# Patient Record
Sex: Male | Born: 1940
Health system: Southern US, Community
[De-identification: ages and names within clinical notes are randomized; demographics above are authoritative.]

## PROBLEM LIST (undated history)

## (undated) ENCOUNTER — Emergency Department (HOSPITAL_COMMUNITY): Discharge status: Absent without leave | Payer: Medicare HMO

## (undated) DIAGNOSIS — K635 Polyp of colon: Secondary | ICD-10-CM

## (undated) DIAGNOSIS — C61 Malignant neoplasm of prostate: Secondary | ICD-10-CM

## (undated) DIAGNOSIS — F329 Major depressive disorder, single episode, unspecified: Secondary | ICD-10-CM

## (undated) DIAGNOSIS — D72820 Lymphocytosis (symptomatic): Secondary | ICD-10-CM

## (undated) DIAGNOSIS — Z8669 Personal history of other diseases of the nervous system and sense organs: Secondary | ICD-10-CM

## (undated) DIAGNOSIS — F32A Depression, unspecified: Secondary | ICD-10-CM

## (undated) DIAGNOSIS — E785 Hyperlipidemia, unspecified: Secondary | ICD-10-CM

## (undated) DIAGNOSIS — I10 Essential (primary) hypertension: Secondary | ICD-10-CM

## (undated) DIAGNOSIS — Z87442 Personal history of urinary calculi: Secondary | ICD-10-CM

## (undated) DIAGNOSIS — G244 Idiopathic orofacial dystonia: Secondary | ICD-10-CM

## (undated) HISTORY — DX: Malignant neoplasm of prostate: C61

## (undated) HISTORY — DX: Polyp of colon: K63.5

## (undated) HISTORY — DX: Hyperlipidemia, unspecified: E78.5

## (undated) HISTORY — PX: PROSTATE BIOPSY: SHX241

## (undated) HISTORY — DX: Essential (primary) hypertension: I10

## (undated) HISTORY — DX: Depression, unspecified: F32.A

## (undated) HISTORY — DX: Idiopathic orofacial dystonia: G24.4

## (undated) HISTORY — DX: Lymphocytosis (symptomatic): D72.820

## (undated) HISTORY — DX: Major depressive disorder, single episode, unspecified: F32.9

---

## 1978-12-26 HISTORY — PX: URETEROLITHOTOMY: SHX71

## 2001-12-26 HISTORY — PX: REFRACTIVE SURGERY: SHX103

## 2002-03-29 ENCOUNTER — Encounter: Admission: RE | Admit: 2002-03-29 | Discharge: 2002-04-28 | Payer: Self-pay | Admitting: Pulmonary Disease

## 2005-01-28 ENCOUNTER — Ambulatory Visit: Payer: Self-pay | Admitting: Internal Medicine

## 2005-12-26 HISTORY — PX: COLONOSCOPY: SHX174

## 2006-03-09 ENCOUNTER — Ambulatory Visit: Payer: Self-pay | Admitting: Internal Medicine

## 2006-03-29 ENCOUNTER — Ambulatory Visit: Payer: Self-pay | Admitting: Internal Medicine

## 2008-01-18 ENCOUNTER — Ambulatory Visit: Payer: Self-pay | Admitting: Internal Medicine

## 2008-01-18 ENCOUNTER — Encounter: Payer: Self-pay | Admitting: Internal Medicine

## 2008-01-18 DIAGNOSIS — Z87442 Personal history of urinary calculi: Secondary | ICD-10-CM | POA: Insufficient documentation

## 2008-01-18 DIAGNOSIS — F329 Major depressive disorder, single episode, unspecified: Secondary | ICD-10-CM | POA: Insufficient documentation

## 2008-01-18 DIAGNOSIS — E785 Hyperlipidemia, unspecified: Secondary | ICD-10-CM | POA: Insufficient documentation

## 2008-01-18 DIAGNOSIS — I10 Essential (primary) hypertension: Secondary | ICD-10-CM | POA: Insufficient documentation

## 2008-01-18 DIAGNOSIS — R972 Elevated prostate specific antigen [PSA]: Secondary | ICD-10-CM | POA: Insufficient documentation

## 2008-01-18 DIAGNOSIS — N400 Enlarged prostate without lower urinary tract symptoms: Secondary | ICD-10-CM | POA: Insufficient documentation

## 2008-01-18 DIAGNOSIS — R21 Rash and other nonspecific skin eruption: Secondary | ICD-10-CM | POA: Insufficient documentation

## 2008-01-18 DIAGNOSIS — R1319 Other dysphagia: Secondary | ICD-10-CM | POA: Insufficient documentation

## 2008-01-30 ENCOUNTER — Encounter: Payer: Self-pay | Admitting: Internal Medicine

## 2008-02-25 ENCOUNTER — Ambulatory Visit: Payer: Self-pay | Admitting: Gastroenterology

## 2008-03-10 ENCOUNTER — Encounter: Payer: Self-pay | Admitting: Internal Medicine

## 2008-03-10 ENCOUNTER — Encounter: Payer: Self-pay | Admitting: Gastroenterology

## 2008-03-10 ENCOUNTER — Ambulatory Visit: Payer: Self-pay | Admitting: Gastroenterology

## 2008-03-14 ENCOUNTER — Encounter: Payer: Self-pay | Admitting: Internal Medicine

## 2008-03-24 ENCOUNTER — Ambulatory Visit: Admission: RE | Admit: 2008-03-24 | Discharge: 2008-06-22 | Payer: Self-pay | Admitting: Radiation Oncology

## 2008-03-25 ENCOUNTER — Encounter: Payer: Self-pay | Admitting: Internal Medicine

## 2008-04-15 ENCOUNTER — Encounter: Payer: Self-pay | Admitting: Internal Medicine

## 2009-01-28 ENCOUNTER — Encounter: Payer: Self-pay | Admitting: Internal Medicine

## 2009-05-01 ENCOUNTER — Encounter: Payer: Self-pay | Admitting: Internal Medicine

## 2010-02-18 ENCOUNTER — Encounter: Payer: Self-pay | Admitting: Internal Medicine

## 2010-06-18 ENCOUNTER — Encounter: Payer: Self-pay | Admitting: Internal Medicine

## 2011-01-23 LAB — CONVERTED CEMR LAB
ALT: 19 units/L (ref 0–53)
Bilirubin, Direct: 0.2 mg/dL (ref 0.0–0.3)
CO2: 30 meq/L (ref 19–32)
Calcium: 9.8 mg/dL (ref 8.4–10.5)
Cholesterol: 183 mg/dL (ref 0–200)
GFR calc Af Amer: 125 mL/min
Glucose, Bld: 119 mg/dL — ABNORMAL HIGH (ref 70–99)
HDL: 56.6 mg/dL (ref >39.0)
Ketones, ur: NEGATIVE mg/dL
LDL Cholesterol: 116 mg/dL — ABNORMAL HIGH (ref 0–99)
Potassium: 3.8 meq/L (ref 3.5–5.1)
Total CHOL/HDL Ratio: 3.2
Total Protein: 7.5 g/dL (ref 6.0–8.3)
Urine Glucose: NEGATIVE mg/dL

## 2011-01-25 NOTE — Letter (Signed)
[facility]   Imported By: Sherian Rein 07/01/2010 12:39:29  _____________________________________________________________________  External Attachment:    Type:   Image     Comment:   External Document

## 2011-01-28 NOTE — Letter (Signed)
Summary: [facility]   Imported By: Lester Providence 02/25/2010 08:44:09  _____________________________________________________________________  External Attachment:    Type:   Image     Comment:   External Document

## 2014-03-28 ENCOUNTER — Telehealth: Payer: Self-pay | Admitting: *Deleted

## 2014-03-28 NOTE — Telephone Encounter (Signed)
Called patient to introduce myself as Prostate Oncology Navigator and coordinator of the Prostate [place], to confirm his referral for the clinic on 04/04/14, location of [place], arrival time of 8:15 AM, registration procedure, and format of clinic.  He verbalized understanding.  I provided my phone number and encouraged him to call me if he has any questions after receiving the Information Packet or prior to my call the day before clinic.  He verbalized understanding and expressed appreciation for my call.  Gayleen Orem, RN, BSN, [facility] Prostate Oncology Navigator [phone]

## 2014-04-01 ENCOUNTER — Telehealth: Payer: Self-pay | Admitting: Oncology

## 2014-04-01 NOTE — Telephone Encounter (Signed)
C/D 04/01/14 for appt. 04/04/14 °

## 2014-04-02 ENCOUNTER — Encounter: Payer: Self-pay | Admitting: Radiation Oncology

## 2014-04-02 DIAGNOSIS — C61 Malignant neoplasm of prostate: Secondary | ICD-10-CM | POA: Insufficient documentation

## 2014-04-02 NOTE — Progress Notes (Signed)
GU Location of Tumor / Histology: prostatic adenocarcinoma  If Prostate Cancer, Gleason Score is (3 + 4) and PSA is (1.74)  Under active surveillance until January 2013 when he underwent a biopsy for a rising PSA.  Biopsies of prostate (if applicable) revealed:    Past/Anticipated interventions by urology, if any: active surveillance and referral to Dr. Tammi Klippel  Past/Anticipated interventions by medical oncology, if any: None  Weight changes, if any: denies  Bowel/Bladder complaints, if any: IPSS 2, denies change in his voiding habits or hematuria, denies night sweat   Nausea/Vomiting, if any: none noted  Pain issues, if any:  None noted  SAFETY ISSUES:  Prior radiation? NO  Pacemaker/ICD? NO  Possible current pregnancy? NO  Is the patient on methotrexate? NO  Current Complaints / other details:  48.9 cc prostate. 73 year old male. Widowed. Retired.

## 2014-04-03 ENCOUNTER — Encounter: Payer: Self-pay | Admitting: Radiation Oncology

## 2014-04-03 ENCOUNTER — Telehealth: Payer: Self-pay | Admitting: *Deleted

## 2014-04-03 NOTE — Progress Notes (Signed)
Radiation Oncology         [phone] ________________________________  Multidisciplinary Prostate Cancer Clinic  Initial Radiation Oncology Consultation  Name: Patrick Dudley MRN: [id number]  Date: 04/04/2014  DOB: 1941/09/11  WC:HENID Patrick Reichmann, MD  Dutch Gray, MD   REFERRING PHYSICIAN: Dutch Gray, MD  DIAGNOSIS: 73 y.o. gentleman with stage T1c adenocarcinoma of the prostate with a Gleason's score of 3+4 and a PSA of 4.59  HISTORY OF PRESENT ILLNESS::Patrick Dudley is a 73 y.o. gentleman who was noted to have an elevated PSA by his primary care physician, Dr. Cathlean Dudley in 2009.  Specifically, on January 18, 2008, his PSA was 4.19.  This represented an increase over the previous 2 years and the patient was referred to Dr. Raynelle Dudley for further evaluation.  Dr. Alinda Dudley performed a digital rectal exam on January 30, 2008.  Rectal exam showed normal sphincter tone and the prostate was estimated at 40 grams and there was no nodularity appreciated.  The patient subsequently returned for transrectal ultrasound with prostate biopsies on March 4th.2009.  The prostate was measured at 58.95 cc's with no hypoechoic nodules.  Biopsies showed adenocarcinoma with a Gleason Score of 3+3, occupying less than 5% of the left apex specimens.  The remainder of the submitted tissue showed benign prostate.  Patrick Dudley has returned to discuss the pathology findings with Dr. Raynelle Dudley.  He was kindly referred to meet with me on 03/25/08 for consultation regarding radiation therapy.  He decided on active surveillance with ongoing PSA's and biopsies on 06/10/08, 10/02/09, 11/05/10, 01/03/12 showing no progression.   On 03/21/14, the patient proceeded to transrectal ultrasound with biopsies.  Out of 12 core biopsies, one was positive.  The maximum Gleason score was 3+4, and this was seen in the left lateral mid gland.  The patient reviewed the biopsy results with his urologist and he has kindly been referred today to the  multidisciplinary prostate cancer clinic for presentation of pathology and radiology studies in our conference for discussion of potential radiation treatment options and clinical evaluation.  PREVIOUS RADIATION THERAPY: No  PAST MEDICAL HISTORY:  has a past medical history of Prostate cancer; Depression; Dyslipidemia; Glaucoma; Hypertension; Lymphocytosis (symptomatic); Personal history of urinary calculi; and Orofacial dyskinesia.    PAST SURGICAL HISTORY: Past Surgical History  Procedure Laterality Date  . Lithotripsy    . Prostate biopsy      FAMILY HISTORY: family history includes Cancer in his brother and father; Nephrolithiasis in his brother.  SOCIAL HISTORY:  reports that he quit smoking about 26 years ago. His smoking use included Cigarettes. He smoked 0.00 packs per day. He has never used smokeless tobacco. He reports that he does not drink alcohol or use illicit drugs.  ALLERGIES: Review of patient's allergies indicates no known allergies.  MEDICATIONS:  Current Outpatient Prescriptions  Medication Sig Dispense Refill  . AMLODIPINE BESYLATE PO Take 10 mg by mouth.      Marland Kitchen aspirin 81 MG tablet Take 81 mg by mouth daily.      . clonazePAM (KLONOPIN) 0.5 MG tablet Take 0.5 mg by mouth 2 (two) times daily as needed for anxiety.      . mirtazapine (REMERON) 15 MG tablet Take 15 mg by mouth at bedtime.      . Misc Natural Products (PROSTATE SUPPORT PO) Take by mouth.      . Omega-3 Fatty Acids (FISH OIL) 1000 MG CAPS Take by mouth.       No current facility-administered medications  for this encounter.    REVIEW OF SYSTEMS:  A 15 point review of systems is documented in the electronic medical record. This was obtained by the nursing staff. However, I reviewed this with the patient to discuss relevant findings and make appropriate changes.  A comprehensive review of systems was negative..  The patient completed an IPSS and IIEF questionnaire.  His IPSS score was 2 indicating mild  urinary outflow obstructive symptoms.  He indicated that his erectile function is able to complete sexual activity 'always.'   PHYSICAL EXAM: This patient is in no acute distress.  He is alert and oriented.   He exhibits no respiratory distress or labored breathing.  He appears neurologically intact.  His mood is pleasant.  His affect is appropriate.  Please note the digital rectal exam findings described above.  KPS = 100  100 - Normal; no complaints; no evidence of disease. 90   - Able to carry on normal activity; minor signs or symptoms of disease. 80   - Normal activity with effort; some signs or symptoms of disease. 15   - Cares for self; unable to carry on normal activity or to do active work. 60   - Requires occasional assistance, but is able to care for most of his personal needs. 50   - Requires considerable assistance and frequent medical care. 25   - Disabled; requires special care and assistance. 51   - Severely disabled; hospital admission is indicated although death not imminent. 13   - Very sick; hospital admission necessary; active supportive treatment necessary. 10   - Moribund; fatal processes progressing rapidly. 0     - Dead  Karnofsky DA, Abelmann [place], Craver LS and Burchenal [facility] [phone]) The use of the nitrogen mustards in the palliative treatment of carcinoma: with particular reference to bronchogenic carcinoma Cancer 1 [phone]   LABORATORY DATA:  No results found for this basename: WBC,  HGB,  HCT,  MCV,  PLT   Lab Results  Component Value Date   NA 141 01/18/2008   K 3.8 01/18/2008   CL 104 01/18/2008   CO2 30 01/18/2008   Lab Results  Component Value Date   ALT 19 01/18/2008   AST 27 01/18/2008   ALKPHOS 69 01/18/2008   BILITOT 1.2 01/18/2008     RADIOGRAPHY: No results found.    IMPRESSION: This gentleman is a 73 y.o. gentleman with stage T1c adenocarcinoma of the prostate with a Gleason's score of 3+4 and a PSA of 4.59.  His T-Stage, Gleason's Score, and PSA  put him into the intermediate risk group.  He falls into a subset of intermediate risk patients with primary Gleason grade 3 and lower volume unilateral disease, who do very well with brachytherapy alone. Accordingly he is eligible for a variety of potential treatment options including prostatectomy, IMRT or seed implant.  PLAN:Today I reviewed the findings and workup thus far.  We discussed the natural history of prostate cancer.  We reviewed the the implications of T-stage, Gleason's Score, and PSA on decision-making and outcomes in prostate cancer.  We discussed radiation treatment in the management of prostate cancer with regard to the logistics and delivery of external beam radiation treatment as well as the logistics and delivery of prostate brachytherapy.  We compared and contrasted each of these approaches and also compared these against prostatectomy.  The patient expressed interest in prostate brachytherapy.  I filled out a patient counseling form for him with relevant treatment diagrams and we retained  a copy for our records.   The patient would like to proceed with prostate brachytherapy.  I will share my findings with Dr. Alinda Dudley and move forward with scheduling the procedure in the near future.     I enjoyed meeting with him today, and will look forward to participating in the care of this very nice gentleman.   I spent 40 minutes face to face with the patient and more than 50% of that time was spent in counseling and/or coordination of care.   ------------------------------------------------  Sheral Apley. Tammi Klippel, M.D.

## 2014-04-03 NOTE — Telephone Encounter (Signed)
Called patient to see if he had any questions prior to his attendance at tomorrow's Prostate [place].  He stated he did not.  He confirmed he would bring the completed health information forms he received in the mailing I sent, confirmed his understanding of a  8:15 arrival time, confirmed his understanding of the [facility] location.  Gayleen Orem, RN, BSN, [facility] Prostate Oncology Navigator [phone]

## 2014-04-04 ENCOUNTER — Encounter: Payer: Self-pay | Admitting: Specialist

## 2014-04-04 ENCOUNTER — Ambulatory Visit (HOSPITAL_BASED_OUTPATIENT_CLINIC_OR_DEPARTMENT_OTHER): Payer: Medicare Other | Admitting: Oncology

## 2014-04-04 ENCOUNTER — Encounter: Payer: Self-pay | Admitting: *Deleted

## 2014-04-04 ENCOUNTER — Encounter: Payer: Self-pay | Admitting: Oncology

## 2014-04-04 ENCOUNTER — Encounter: Payer: Self-pay | Admitting: Radiation Oncology

## 2014-04-04 ENCOUNTER — Telehealth: Payer: Self-pay | Admitting: *Deleted

## 2014-04-04 ENCOUNTER — Ambulatory Visit
Admission: RE | Admit: 2014-04-04 | Discharge: 2014-04-04 | Disposition: A | Discharge status: Home / Self Care | Payer: Medicare Other | Source: Ambulatory Visit | Attending: Radiation Oncology | Admitting: Radiation Oncology

## 2014-04-04 VITALS — BP 171/92 | HR 58 | Temp 98.7°F | Resp 16 | Ht 71.0 in | Wt 138.0 lb

## 2014-04-04 DIAGNOSIS — C61 Malignant neoplasm of prostate: Secondary | ICD-10-CM

## 2014-04-04 NOTE — Progress Notes (Signed)
Met with patient and friend in prostrate clinic. Patient is a warm and friendly man. Chaplain provided education about [employer] and services. Reviewed the Distress Screen with him and explored his emotional resources. He said he has minimal distress and rated himself as "0" on the Distress Scale. He has a strong [place] faith that he uses well for coping. Patient requested prayer; he prayed as well as the chaplain.   Epifania Gore, Bonney Roussel, [facility], PhD [phone]; [phone]

## 2014-04-04 NOTE — Telephone Encounter (Signed)
Called patient to inform of pre-seed appt. For 04-10-14 @ 9:00 am, lvm for a return call

## 2014-04-04 NOTE — Progress Notes (Signed)
Please see consult note.  

## 2014-04-04 NOTE — Consult Note (Signed)
Reason for Referral: Prostate cancer  HPI: This is a 73 year old gentleman who is a relatively healthy except for mild hyperlipidemia and depression. He does have a history of orofacial dyskinesia that has been reasonably manageable. His history of prostate cancer dates back to March of 2009 where he presented with a PSA of 4.19 and a biopsy showed the presence of low volume Gleason score 3+3 equals 6 prostate cancer. He had 1/12 cores involved and completely asymptomatic. He had a clinical stage TI C. He continued to be under the care of Dr. Alinda Money with active surveillance. He had repeat biopsies in June of 2009, October 2010, November 2011, January of 2013, and most recently in March of 2015.  His most recent PSA continue to be low at 1.74 that his repeat biopsy showed another core of Gleason score 3+4 equals 7 in the left lateral mid section. The majority of the other cores show high-grade PIN. Patient referred to the prostate cancer multidisciplinary clinic for a discussion. Clinically, he is asymptomatic. He did not report any urinary symptoms. He does not report any frequency urgency or hematuria. He does not have any lower urinary tract symptoms or erectile dysfunction symptoms.   Past Medical History  Diagnosis Date  . Prostate cancer   . Depression   . Dyslipidemia   . Glaucoma   . Hypertension   . Lymphocytosis (symptomatic)   . Personal history of urinary calculi   . Orofacial dyskinesia   :  Past Surgical History  Procedure Laterality Date  . Lithotripsy    . Prostate biopsy    :  Current Outpatient Prescriptions  Medication Sig Dispense Refill  . AMLODIPINE BESYLATE PO Take 10 mg by mouth.      Marland Kitchen aspirin 81 MG tablet Take 81 mg by mouth daily.      . clonazePAM (KLONOPIN) 0.5 MG tablet Take 0.5 mg by mouth 2 (two) times daily as needed for anxiety.      . mirtazapine (REMERON) 15 MG tablet Take 15 mg by mouth at bedtime.      . Misc Natural Products (PROSTATE SUPPORT PO)  Take by mouth.      . Omega-3 Fatty Acids (FISH OIL) 1000 MG CAPS Take by mouth.       No current facility-administered medications for this visit.      No Known Allergies:  Family History  Problem Relation Age of Onset  . Cancer Father     prostate  . Nephrolithiasis Brother   . Cancer Brother     prostate  :  History   Social History  . Marital Status: Widowed    Spouse Name: N/A    Number of Children: N/A  . Years of Education: N/A   Occupational History  . Not on file.   Social History Main Topics  . Smoking status: Former Smoker    Types: Cigarettes    Quit date: 12/27/1987  . Smokeless tobacco: Never Used  . Alcohol Use: No  . Drug Use: No  . Sexual Activity: No   Other Topics Concern  . Not on file   Social History Narrative  . No narrative on file  :  Constitutional: negative for anorexia, chills and fatigue Eyes: negative for icterus and irritation Ears, nose, mouth, throat, and face: negative for earaches, epistaxis and hearing loss Respiratory: negative for asthma and sputum Cardiovascular: negative for chest pain, exertional chest pressure/discomfort and irregular heart beat Gastrointestinal: negative for abdominal pain, diarrhea and nausea Genitourinary:negative for  dysuria, frequency and hematuria Integument/breast: negative for pruritus and rash Hematologic/lymphatic: negative for bleeding, easy bruising and lymphadenopathy Musculoskeletal:negative for arthralgias, back pain and bone pain Neurological: negative for coordination problems, dizziness and gait problems Behavioral/Psych: negative for anxiety, depression and fatigue Endocrine: negative for temperature intolerance Allergic/Immunologic: negative for anaphylaxis and urticaria    Exam:ECOG 0 There were no vitals taken for this visit. General appearance: alert, cooperative and appears stated age Throat: lips, mucosa, and tongue normal; teeth and gums normal Neck: no adenopathy,  no carotid bruit, no JVD, supple, symmetrical, trachea midline and thyroid not enlarged, symmetric, no tenderness/mass/nodules Back: symmetric, no curvature. ROM normal. No CVA tenderness. Resp: clear to auscultation bilaterally Chest wall: no tenderness Cardio: regular rate and rhythm, S1, S2 normal, no murmur, click, rub or gallop GI: soft, non-tender; bowel sounds normal; no masses,  no organomegaly Extremities: extremities normal, atraumatic, no cyanosis or edema Pulses: 2+ and symmetric Skin: Skin color, texture, turgor normal. No rashes or lesions Lymph nodes: Cervical, supraclavicular, and axillary nodes normal. Neurologic: Grossly normal    Assessment and Plan:   73 year old gentleman with prostate cancer dating back to 2009. He presented with a PSA of 4.19 and a Gleason score 3+3 equals 6 he had low-volume disease with 1/12 cores. He had been on active surveillance under the care of Dr. Alinda Money and a repeat biopsy in March of 2015 showed a higher Gleason score of 3+4 equals 7. His PSA is 1.74 he continues to have low volume disease and no lower urinary tract symptoms. The natural course of this disease was discussed today in detail with the patient and his wife. His pathology slides were discussed with the reviewing pathologists also his case was discussed extensively of a part of her multidisciplinary team. We feel that given the transition from a Gleason 6 to a Gleason 7 prostate cancer warrants a discussion regarding treatment at this time. Although active surveillance might continue be an option for him, he would be at increased risk of developing progressive disease and potentially lethal disease left untreated.  Options of treatments were discussed today quitting surgical resection versus radiation therapy in the form of external beam radiation or seed implants. He is interested in the seed implant form of radiation and seems to be leaning that way. Dr. Tammi Klippel from radiation oncology  to discuss that with him and probably will arrange for that in the near future.  I see no role for any systemic therapy at this time but I explained to him he develops advanced disease he will require systemic therapy possibly in the form of hormonal therapy and maybe systemic chemotherapy if he becomes castration resistant disease.

## 2014-04-04 NOTE — Progress Notes (Signed)
Denies hx of radiation therapy. Denies having a pacemaker. NKDA. Reports that he is retired. Widowed. Has one daughter, Llana Aliment, who is 80, works for [employer], and resides in [place]. Reports that he wears glasses.

## 2014-04-04 NOTE — Consult Note (Signed)
Chief Complaint  Prostate cancer   Reason For Visit  Reason for consult: To discuss treatment options for prostate cancer with the multidisciplinary prostate cancer team.   History of Present Illness  Patrick Dudley is a 73 year old gentleman with prostate cancer initially diagnosed in 2009. He has been counseled by myself and Dr. Tammi Klippel and elected to be managed with active surveillance. He had stable findings on surveillance biopsies until January 2013 when he underwent a biopsy for a rising PSA (4.59) which demonstrated only Gleason 3+3=6 adenocarcinoma with PIN-like ductal adenocarcinoma but with an area of atypical cribiform glands suspicious for cribiform adenocarcinoma.  We again discussed options and he elected to continue with surveillance. In March 2015, he underwent a surveillance biopsy with upgraded Gleason 3+4=7 adenocarcinoma noted.  Initial diagnosis: March 2009 TNM stage: cT1c Nx Mx Gleason score: 3+3=6 PSA: 4.19 Initial biopsy (02/27/08): 1/12 cores positive -- 1/2 cores from L apex (<5%), Volume 59cc Prostate Px Plus (from biopsy in June 2009): 73% risk of favorable pathology, 4% risk of disease progression  Surveillance biopsy (06/10/08): 2/24 cores positive -- L apex (5%) and L lateral apex (10%), Volume 48.9cc Surveillance biopsy (10/02/09): 12 core - atypical glands, HGPIN, no malignancy  Surveillance biopsy (11/05/10): 14 core - L lateral apex (5%,3+3=6), L apex (HGPIN), L lateral mid (10%, 3+3=6), L base (HGPIN) Surveillance biopsy (01/03/12): 12 cores (performed due to PSA rise to 4.59) - L base (< 5%, 3+3=6), R mid (15%, PIN-like ductal adenocarcinoma, 3+3=6), L mid (atypical cribiform glands suspicious for cribiform adenocarcinoma) Surveillance biopsy (03/11/14):PSA 2.81, 12 cores - L lateral mid (60%, 3+4=7), Multiple areas of atypical glands and HGPIN, Vol 48 cc   Family history: Both parents lived into their late 72s. His brother currently has metastatic prostate  cancer. Baseline SHIM: 24 Baseline IPSS: 2   Interval history:  He follows up today in the company of his girlfriend to discuss his recent biopsy results and options for management/treatment of his prostate cancer.  Unfortunately, he was found to have upgraded disease with Gleason 3+4 = 7 disease in 1 core in the left side of the prostate.  He otherwise remains asymptomatic.     Past Medical History  1. History of Dyslipidemia (272.4)  2. History of depression (V11.8)  3. History of glaucoma (V12.49)  4. History of hypertension (V12.59)  5. History of kidney stones (V13.01)  6. 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 Prostate cancer (185)  Surgical History  1. History of Lipectomy  2. History of Lithotripsy - Whole Body (Extracorporeal Shock Wave)  Current Meds  1. AmLODIPine Besylate 10 MG Oral Tablet;  Therapy: (Recorded:04Feb2009) to Recorded  2. Aspirin 81 MG Oral Tablet;  Therapy: (Recorded:11May2012) to Recorded  3. ClonazePAM 0.5 MG Oral Tablet;  Therapy: (Recorded:04Feb2009) to Recorded  4. Fish Oil CAPS;  Therapy: (Recorded:11May2012) to Recorded  5. Mirtazapine 15 MG Oral Tablet;  Therapy: (Recorded:04Feb2009) to Recorded  6. Prostate Support TABS;  Therapy: (Recorded:11May2012) to Recorded  Allergies  1. No Known Drug Allergies  Family History  1. Family history of Nephrolithiasis : Brother  2. Family history of Prostate Cancer (W09.81) : Father  3. Family history of Prostate Cancer (X91.47) : Brother  Social History   Denied: Alcohol Use   Marital History - Widowed   Occupation:   Tobacco Use (V15.82)  Physical Exam Constitutional: Well nourished and well developed . No acute distress.  ENT:. The ears and nose are normal in appearance.  Neck:  The appearance of the neck is normal and no neck mass is present.  Pulmonary: No respiratory distress, normal respiratory rhythm and effort and clear bilateral breath sounds.  Cardiovascular: Heart rate  and rhythm are normal . No peripheral edema.  Skin: Normal skin turgor, no visible rash and no visible skin lesions.  Neuro/Psych:. Mood and affect are appropriate.    Results/Data  I have reviewed his pathology slides today in conference as well as reviewed his medical records and recent PSA results and his prior biopsy results.     Assessment  1. Prostate cancer (185)  Discussion/Summary     1.  Prostate cancer: We have reviewed options for treatment/management considering his upgraded disease.  He understands a continued surveillance would come at an increased risk at this point.  We also discussed the fact that he has tremendous longevity in his family with his mother having lived to be over 49 and his father having died at age 37.  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  After our initial discussion, he agrees that he would like to proceed with treatment of curative intent at this point in time.  We therefore discussed options including both a primary surgical approach versus primary radiation therapy.   The patient was counseled about the natural history of prostate cancer and the standard treatment options that are available for prostate cancer. It was explained to him how his age and life expectancy, clinical stage, Gleason score, and PSA affect his prognosis, the decision to proceed with additional staging studies, as well as how that information influences recommended treatment strategies. We discussed the roles for active surveillance, radiation therapy, surgical therapy, androgen deprivation, as well as ablative therapy options for the treatment of prostate cancer as appropriate to his individual cancer situation. We discussed the risks and benefits of these options with regard to their impact on cancer control and also in terms of potential adverse events, complications, and impact on quiality of life  particularly related to urinary, bowel, and sexual function. The patient was encouraged to ask questions throughout the discussion today and all questions were answered to his stated satisfaction. In addition, the patient was provided with and/or directed to appropriate resources and literature for further education about prostate cancer and treatment options.   After reviewing his options in detail discussing the pros and cons of different approaches, he is confident that he would like to see with a radiation seed implantation and has discussed this in detail with Dr. Tammi Klippel today.  He will be scheduled for a CT arch study and we will tentatively plan on scheduling a date for his seed implant.  He also reminds me that he strongly wishes to come into the office the following day to have his catheter removed by our staff as he feels very uncomfortable removing the catheter himself.  Cc: Dr. Tyler Pita Dr. Zola Button Dr. Cathlean Cower  A total of 45 minutes were spent in the overall care of the patient today with 45 minutes in direct face to face consultation.    Signatures Electronically signed by : Patrick Dudley, M.D.; Apr 04 2014 11:06AM EST

## 2014-04-04 NOTE — Progress Notes (Signed)
Met with patient as part of Prostate MDC.  Reintroduced my role as his navigator and encouraged him to call as he proceeds with treatments and appointments at [facility].  Provided the accompanying Care Plan Summary at the close of clinic:                                        Care Plan Summary  Name:  Patrick Dudley DOB:  Mar 21, 1941  Your Medical Team:   Urologist -  Dr. Raynelle Bring, [facility]  Radiation Oncologist - Dr. Tyler Pita, [facility]   Medical Oncologist - Dr. Zola Button, [place]  Recommendation: 1) Radioactive Seed Implant * These recommendations are based on information available as of today's consult.      Recommendations may change depending on the results of further tests or exams.  Next Step: 1) Enid Derry will be calling you to schedule [phone]).  When appointments need to be scheduled, you will be contacted by [facility] and/or [facility].  Questions? Please do not hesitate to call Gayleen Orem, RN, BSN, [facility] at [phone] with any questions or concerns.  Liliane Channel is Counsellor and is available to assist you while you're receiving your medical care at [facility]. ______________________________________________________________________________________________________________________  I encouraged him to call me with any questions or concerns as his treatments progress.  He indicated understanding.  Gayleen Orem, RN, BSN, [facility] Prostate Oncology Navigator [phone]   .

## 2014-04-07 ENCOUNTER — Other Ambulatory Visit: Payer: Self-pay | Admitting: Urology

## 2014-04-07 ENCOUNTER — Telehealth: Payer: Self-pay | Admitting: *Deleted

## 2014-04-07 NOTE — Telephone Encounter (Signed)
Called patient to inform of implant date, no answer will call later. 

## 2014-04-09 ENCOUNTER — Encounter: Payer: Self-pay | Admitting: Radiation Oncology

## 2014-04-09 ENCOUNTER — Telehealth: Payer: Self-pay | Admitting: *Deleted

## 2014-04-09 NOTE — Progress Notes (Signed)
  Radiation Oncology         [phone] ________________________________  Name: TYDARIUS YAWN MRN: [id number]  Date: 04/10/2014  DOB: September 14, 1941  SIMULATION AND TREATMENT PLANNING NOTE PUBIC ARCH STUDY  VE:LFYBO Jenny Reichmann, MD  Dutch Gray, MD  DIAGNOSIS: 73 y.o. gentleman with stage T1c adenocarcinoma of the prostate with a Gleason's score of 3+4 and a PSA of 4.59  COMPLEX SIMULATION:  The patient presented today for evaluation for possible prostate seed implant. He was brought to the radiation planning suite and placed supine on the CT couch. A 3-dimensional image study set was obtained in upload to the planning computer. There, on each axial slice, I contoured the prostate gland. Then, using three-dimensional radiation planning tools I reconstructed the prostate in view of the structures from the transperineal needle pathway to assess for possible pubic arch interference. In doing so, I did not appreciate any pubic arch interference. Also, the patient's prostate volume was estimated based on the drawn structure. The volume was 48 cc.  Given the pubic arch appearance and prostate volume, patient remains a good candidate to proceed with prostate seed implant. Today, he freely provided informed written consent to proceed.    PLAN: The patient will undergo prostate seed implant.   ________________________________  Sheral Apley. Tammi Klippel, M.D.

## 2014-04-09 NOTE — Telephone Encounter (Signed)
CALLED PATIENT TO REMIND OF APPT. FOR 04-10-14 @ 9 AM, LVM FOR A RETURN CALL

## 2014-04-10 ENCOUNTER — Ambulatory Visit
Admission: RE | Admit: 2014-04-10 | Discharge: 2014-04-10 | Disposition: A | Discharge status: Home / Self Care | Payer: Medicare Other | Source: Ambulatory Visit | Attending: Radiation Oncology | Admitting: Radiation Oncology

## 2014-04-10 VITALS — Wt 126.3 lb

## 2014-04-10 DIAGNOSIS — C61 Malignant neoplasm of prostate: Secondary | ICD-10-CM

## 2014-04-10 NOTE — Addendum Note (Signed)
Encounter addended by: Heywood Footman, RN on: 04/10/2014 10:40 AM<BR>     Documentation filed: Vitals Section

## 2014-04-11 ENCOUNTER — Encounter (HOSPITAL_BASED_OUTPATIENT_CLINIC_OR_DEPARTMENT_OTHER)
Admission: RE | Admit: 2014-04-11 | Discharge: 2014-04-11 | Disposition: A | Discharge status: Home / Self Care | Payer: Medicare Other | Source: Ambulatory Visit | Attending: Urology | Admitting: Urology

## 2014-04-11 ENCOUNTER — Other Ambulatory Visit: Payer: Self-pay

## 2014-04-11 ENCOUNTER — Ambulatory Visit (HOSPITAL_BASED_OUTPATIENT_CLINIC_OR_DEPARTMENT_OTHER)
Admission: RE | Admit: 2014-04-11 | Discharge: 2014-04-11 | Disposition: A | Discharge status: Home / Self Care | Payer: Medicare Other | Source: Ambulatory Visit | Attending: Urology | Admitting: Urology

## 2014-04-11 DIAGNOSIS — Z01818 Encounter for other preprocedural examination: Secondary | ICD-10-CM | POA: Insufficient documentation

## 2014-04-11 DIAGNOSIS — C61 Malignant neoplasm of prostate: Secondary | ICD-10-CM | POA: Insufficient documentation

## 2014-04-11 DIAGNOSIS — I517 Cardiomegaly: Secondary | ICD-10-CM | POA: Insufficient documentation

## 2014-04-11 DIAGNOSIS — Z01810 Encounter for preprocedural cardiovascular examination: Secondary | ICD-10-CM | POA: Insufficient documentation

## 2014-05-28 ENCOUNTER — Telehealth: Payer: Self-pay | Admitting: *Deleted

## 2014-05-28 ENCOUNTER — Encounter (HOSPITAL_BASED_OUTPATIENT_CLINIC_OR_DEPARTMENT_OTHER): Payer: Self-pay | Admitting: *Deleted

## 2014-05-28 NOTE — Telephone Encounter (Signed)
Called patient to remind of appt., lvm for a return call

## 2014-05-29 LAB — COMPREHENSIVE METABOLIC PANEL
ALT: 11 U/L (ref 0–53)
AST: 21 U/L (ref 0–37)
Albumin: 4.5 g/dL (ref 3.5–5.2)
Alkaline Phosphatase: 63 U/L (ref 39–117)
BUN: 10 mg/dL (ref 6–23)
CO2: 32 meq/L (ref 19–32)
Calcium: 9.7 mg/dL (ref 8.4–10.5)
Chloride: 100 mEq/L (ref 96–112)
Creatinine, Ser: 0.71 mg/dL (ref 0.50–1.35)
GFR calc Af Amer: >90 mL/min (ref >90)
Glucose, Bld: 99 mg/dL (ref 70–99)
Potassium: 4.3 mEq/L (ref 3.7–5.3)
SODIUM: 141 meq/L (ref 137–147)
Total Bilirubin: 1.7 mg/dL — ABNORMAL HIGH (ref 0.3–1.2)
Total Protein: 7.6 g/dL (ref 6.0–8.3)

## 2014-05-29 LAB — CBC
HCT: 42.3 % (ref 39.0–52.0)
Hemoglobin: 14.3 g/dL (ref 13.0–17.0)
MCH: 31.4 pg (ref 26.0–34.0)
MCHC: 33.8 g/dL (ref 30.0–36.0)
MCV: 92.8 fL (ref 78.0–100.0)
Platelets: 246 10*3/uL (ref 150–400)
RBC: 4.56 MIL/uL (ref 4.22–5.81)
RDW: 12.5 % (ref 11.5–15.5)
WBC: 6.6 10*3/uL (ref 4.0–10.5)

## 2014-05-29 LAB — PROTIME-INR
INR: 0.98 (ref 0.00–1.49)
Prothrombin Time: 12.8 seconds (ref 11.6–15.2)

## 2014-05-29 LAB — APTT: APTT: 28 s (ref 24–37)

## 2014-05-30 ENCOUNTER — Encounter (HOSPITAL_BASED_OUTPATIENT_CLINIC_OR_DEPARTMENT_OTHER): Payer: Self-pay | Admitting: *Deleted

## 2014-05-30 NOTE — Progress Notes (Signed)
NPO AFTER MN WITH EXCEPTION CLEAR LIQUIDS UNTIL 0830 (NO CREAM/ MILK PRODUCTS).  ARRIVE AT 1300.  CURRENT LAB RESULTS , CXR, AND EKG IN CHART AND EPIC. WILL TAKE NORVASC AND KLONOPIN AM DOS W/ SIPS OF WATER AND DO FLEET ENEMA.

## 2014-06-04 ENCOUNTER — Telehealth: Payer: Self-pay | Admitting: *Deleted

## 2014-06-04 NOTE — Telephone Encounter (Signed)
CALLED PATIENT TO REMIND OF PROCEDURE FOR 06-05-14, LVM FOR A RETURN CALL

## 2014-06-04 NOTE — H&P (Signed)
Chief Complaint  Prostate cancer     History of Present Illness  Patrick Dudley is a 73 year old gentleman with prostate cancer initially diagnosed in 2009. He has been counseled by myself and Dr. Tammi Klippel and elected to be managed with active surveillance. He had stable findings on surveillance biopsies until January 2013 when he underwent a biopsy for a rising PSA (4.59) which demonstrated only Gleason 3+3=6 adenocarcinoma with PIN-like ductal adenocarcinoma but with an area of atypical cribiform glands suspicious for cribiform adenocarcinoma.  We again discussed options and he elected to continue with surveillance. In March 2015, he underwent a surveillance biopsy with upgraded Gleason 3+4=7 adenocarcinoma noted.  Initial diagnosis: March 2009 TNM stage: cT1c Nx Mx Gleason score: 3+3=6 PSA: 4.19 Initial biopsy (02/27/08): 1/12 cores positive -- 1/2 cores from L apex (<5%), Volume 59cc Prostate Px Plus (from biopsy in June 2009): 73% risk of favorable pathology, 4% risk of disease progression  Surveillance biopsy (06/10/08): 2/24 cores positive -- L apex (5%) and L lateral apex (10%), Volume 48.9cc Surveillance biopsy (10/02/09): 12 core - atypical glands, HGPIN, no malignancy  Surveillance biopsy (11/05/10): 14 core - L lateral apex (5%,3+3=6), L apex (HGPIN), L lateral mid (10%, 3+3=6), L base (HGPIN) Surveillance biopsy (01/03/12): 12 cores (performed due to PSA rise to 4.59) - L base (< 5%, 3+3=6), R mid (15%, PIN-like ductal adenocarcinoma, 3+3=6), L mid (atypical cribiform glands suspicious for cribiform adenocarcinoma) Surveillance biopsy (03/11/14):PSA 2.81, 12 cores - L lateral mid (60%, 3+4=7), Multiple areas of atypical glands and HGPIN, Vol 48 cc   Family history: Both parents lived into their late 64s. His brother currently has metastatic prostate cancer. Baseline SHIM: 24 Baseline IPSS: 2       Past Medical History  1. History of Dyslipidemia (272.4)  2. History of depression  (V11.8)  3. History of glaucoma (V12.49)  4. History of hypertension (V12.59)  5. History of kidney stones (V13.01)  6. 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 Prostate cancer (185)  Surgical History  1. History of Lipectomy  2. History of Lithotripsy - Whole Body (Extracorporeal Shock Wave)  Current Meds  1. AmLODIPine Besylate 10 MG Oral Tablet;  Therapy: (Recorded:04Feb2009) to Recorded  2. Aspirin 81 MG Oral Tablet;  Therapy: (Recorded:11May2012) to Recorded  3. ClonazePAM 0.5 MG Oral Tablet;  Therapy: (Recorded:04Feb2009) to Recorded  4. Fish Oil CAPS;  Therapy: (Recorded:11May2012) to Recorded  5. Mirtazapine 15 MG Oral Tablet;  Therapy: (Recorded:04Feb2009) to Recorded  6. Prostate Support TABS;  Therapy: (Recorded:11May2012) to Recorded  Allergies  1. No Known Drug Allergies  Family History  1. Family history of Nephrolithiasis : Brother  2. Family history of Prostate Cancer (T73.22) : Father  3. Family history of Prostate Cancer (G25.42) : Brother  Social History   Denied: Alcohol Use   Marital History - Widowed   Occupation:   Tobacco Use (V15.82)  Physical Exam Constitutional: Well nourished and well developed . No acute distress.  ENT:. The ears and nose are normal in appearance.  Neck: The appearance of the neck is normal and no neck mass is present.  Pulmonary: No respiratory distress, normal respiratory rhythm and effort and clear bilateral breath sounds.  Cardiovascular: Heart rate and rhythm are normal . No peripheral edema.  Skin: Normal skin turgor, no visible rash and no visible skin lesions.  Neuro/Psych:. Mood and affect are appropriate.   Discussion/Summary     1.  Prostate cancer:  After reviewing his options in  detail discussing the pros and cons of different approaches, he is confident that he would like to proceed with a radiation seed implantation.

## 2014-06-05 ENCOUNTER — Encounter (HOSPITAL_BASED_OUTPATIENT_CLINIC_OR_DEPARTMENT_OTHER)
Admission: RE | Disposition: A | Discharge status: Home / Self Care | Payer: Self-pay | Source: Ambulatory Visit | Attending: Urology

## 2014-06-05 ENCOUNTER — Ambulatory Visit (HOSPITAL_BASED_OUTPATIENT_CLINIC_OR_DEPARTMENT_OTHER)
Admission: RE | Admit: 2014-06-05 | Discharge: 2014-06-05 | Disposition: A | Discharge status: Home / Self Care | Payer: Medicare Other | Source: Ambulatory Visit | Attending: Urology | Admitting: Urology

## 2014-06-05 ENCOUNTER — Ambulatory Visit (HOSPITAL_COMMUNITY): Payer: Medicare Other

## 2014-06-05 ENCOUNTER — Encounter (HOSPITAL_BASED_OUTPATIENT_CLINIC_OR_DEPARTMENT_OTHER): Payer: Medicare Other | Admitting: Anesthesiology

## 2014-06-05 ENCOUNTER — Ambulatory Visit (HOSPITAL_BASED_OUTPATIENT_CLINIC_OR_DEPARTMENT_OTHER): Payer: Medicare Other | Admitting: Anesthesiology

## 2014-06-05 ENCOUNTER — Encounter (HOSPITAL_BASED_OUTPATIENT_CLINIC_OR_DEPARTMENT_OTHER): Payer: Self-pay | Admitting: *Deleted

## 2014-06-05 DIAGNOSIS — Z79899 Other long term (current) drug therapy: Secondary | ICD-10-CM | POA: Insufficient documentation

## 2014-06-05 DIAGNOSIS — I10 Essential (primary) hypertension: Secondary | ICD-10-CM | POA: Insufficient documentation

## 2014-06-05 DIAGNOSIS — C61 Malignant neoplasm of prostate: Secondary | ICD-10-CM | POA: Insufficient documentation

## 2014-06-05 DIAGNOSIS — Z87891 Personal history of nicotine dependence: Secondary | ICD-10-CM | POA: Insufficient documentation

## 2014-06-05 DIAGNOSIS — F329 Major depressive disorder, single episode, unspecified: Secondary | ICD-10-CM | POA: Insufficient documentation

## 2014-06-05 DIAGNOSIS — Z7982 Long term (current) use of aspirin: Secondary | ICD-10-CM | POA: Insufficient documentation

## 2014-06-05 DIAGNOSIS — F3289 Other specified depressive episodes: Secondary | ICD-10-CM | POA: Insufficient documentation

## 2014-06-05 HISTORY — DX: Personal history of urinary calculi: Z87.442

## 2014-06-05 HISTORY — DX: Personal history of other diseases of the nervous system and sense organs: Z86.69

## 2014-06-05 HISTORY — PX: CYSTOSCOPY: SHX5120

## 2014-06-05 HISTORY — PX: RADIOACTIVE SEED IMPLANT: SHX5150

## 2014-06-05 SURGERY — INSERTION, RADIATION SOURCE, PROSTATE
Anesthesia: General | Site: Prostate

## 2014-06-05 MED ORDER — FENTANYL CITRATE 0.05 MG/ML IJ SOLN
INTRAMUSCULAR | Status: AC
  Filled 2014-06-05: qty 4

## 2014-06-05 MED ORDER — PROPOFOL 10 MG/ML IV BOLUS
INTRAVENOUS | Status: DC | PRN
  Administered 2014-06-05: 150 mg via INTRAVENOUS
  Administered 2014-06-05: 25 mg via INTRAVENOUS

## 2014-06-05 MED ORDER — LACTATED RINGERS IV SOLN
INTRAVENOUS | Status: DC
  Administered 2014-06-05 (×2): via INTRAVENOUS
  Filled 2014-06-05: qty 1000

## 2014-06-05 MED ORDER — TAMSULOSIN HCL 0.4 MG PO CAPS: 0.4000 mg | ORAL_CAPSULE | Freq: Every day | ORAL | Status: DC

## 2014-06-05 MED ORDER — STERILE WATER FOR IRRIGATION IR SOLN
Status: DC | PRN
  Administered 2014-06-05: 3000 mL

## 2014-06-05 MED ORDER — ACETAMINOPHEN 10 MG/ML IV SOLN
INTRAVENOUS | Status: DC | PRN
  Administered 2014-06-05: 1000 mg via INTRAVENOUS

## 2014-06-05 MED ORDER — IOHEXOL 350 MG/ML SOLN
INTRAVENOUS | Status: DC | PRN
  Administered 2014-06-05: 7 mL

## 2014-06-05 MED ORDER — CIPROFLOXACIN HCL 500 MG PO TABS: 500.0000 mg | ORAL_TABLET | Freq: Two times a day (BID) | ORAL | Status: DC

## 2014-06-05 MED ORDER — LIDOCAINE HCL (CARDIAC) 20 MG/ML IV SOLN
INTRAVENOUS | Status: DC | PRN
  Administered 2014-06-05: 50 mg via INTRAVENOUS

## 2014-06-05 MED ORDER — DEXAMETHASONE SODIUM PHOSPHATE 4 MG/ML IJ SOLN
INTRAMUSCULAR | Status: DC | PRN
  Administered 2014-06-05: 10 mg via INTRAVENOUS

## 2014-06-05 MED ORDER — FLEET ENEMA 7-19 GM/118ML RE ENEM
1.0000 | ENEMA | Freq: Once | RECTAL | Status: DC
  Filled 2014-06-05: qty 1

## 2014-06-05 MED ORDER — EPHEDRINE SULFATE 50 MG/ML IJ SOLN
INTRAMUSCULAR | Status: DC | PRN
  Administered 2014-06-05 (×2): 5 mg via INTRAVENOUS
  Administered 2014-06-05: 10 mg via INTRAVENOUS

## 2014-06-05 MED ORDER — HYDROCODONE-ACETAMINOPHEN 5-325 MG PO TABS: 1.0000 | ORAL_TABLET | Freq: Four times a day (QID) | ORAL | Status: DC | PRN

## 2014-06-05 MED ORDER — CIPROFLOXACIN IN D5W 400 MG/200ML IV SOLN
400.0000 mg | INTRAVENOUS | Status: AC
  Administered 2014-06-05: 400 mg via INTRAVENOUS
  Filled 2014-06-05: qty 200

## 2014-06-05 MED ORDER — FENTANYL CITRATE 0.05 MG/ML IJ SOLN
INTRAMUSCULAR | Status: DC | PRN
  Administered 2014-06-05: 50 ug via INTRAVENOUS
  Administered 2014-06-05 (×2): 25 ug via INTRAVENOUS

## 2014-06-05 MED ORDER — BELLADONNA ALKALOIDS-OPIUM 16.2-60 MG RE SUPP
RECTAL | Status: AC
  Filled 2014-06-05: qty 1

## 2014-06-05 MED ORDER — ONDANSETRON HCL 4 MG/2ML IJ SOLN
INTRAMUSCULAR | Status: DC | PRN
  Administered 2014-06-05: 4 mg via INTRAVENOUS

## 2014-06-05 SURGICAL SUPPLY — 30 items
BAG DRAIN URO-CYSTO SKYTR STRL (DRAIN) ×3 IMPLANT
BAG DRN UROCATH (DRAIN) ×2
BAG URINE DRAINAGE (UROLOGICAL SUPPLIES) ×3 IMPLANT
BLADE SURG ROTATE 9660 (MISCELLANEOUS) ×3 IMPLANT
CANISTER SUCT LVC 12 LTR MEDI- (MISCELLANEOUS) IMPLANT
CATH FOLEY 2WAY SLVR  5CC 16FR (CATHETERS) ×2
CATH FOLEY 2WAY SLVR 5CC 16FR (CATHETERS) ×4 IMPLANT
CATH ROBINSON RED A/P 20FR (CATHETERS) ×3 IMPLANT
CLOTH BEACON ORANGE TIMEOUT ST (SAFETY) ×3 IMPLANT
COVER MAYO STAND STRL (DRAPES) ×3 IMPLANT
COVER TABLE BACK 60X90 (DRAPES) ×3 IMPLANT
DRAPE CAMERA CLOSED 9X96 (DRAPES) ×3 IMPLANT
DRSG TEGADERM 4X4.75 (GAUZE/BANDAGES/DRESSINGS) ×3 IMPLANT
DRSG TEGADERM 8X12 (GAUZE/BANDAGES/DRESSINGS) ×3 IMPLANT
ELECT REM PT RETURN 9FT ADLT (ELECTROSURGICAL) ×3
ELECTRODE REM PT RTRN 9FT ADLT (ELECTROSURGICAL) ×2 IMPLANT
GLOVE BIO SURGEON STRL SZ7.5 (GLOVE) ×6 IMPLANT
GLOVE ECLIPSE 8.0 STRL XLNG CF (GLOVE) ×12 IMPLANT
GOWN PREVENTION PLUS LG XLONG (DISPOSABLE) ×3 IMPLANT
GOWN STRL REIN XL XLG (GOWN DISPOSABLE) ×3 IMPLANT
HOLDER FOLEY CATH W/STRAP (MISCELLANEOUS) ×3 IMPLANT
NEEDLE HYPO 22GX1.5 SAFETY (NEEDLE) IMPLANT
NS IRRIG 500ML POUR BTL (IV SOLUTION) IMPLANT
PACK CYSTOSCOPY (CUSTOM PROCEDURE TRAY) ×3 IMPLANT
SPONGE GAUZE 4X4 12PLY STER LF (GAUZE/BANDAGES/DRESSINGS) ×3 IMPLANT
SYRINGE 10CC LL (SYRINGE) ×3 IMPLANT
UNDERPAD 30X30 INCONTINENT (UNDERPADS AND DIAPERS) ×6 IMPLANT
WATER STERILE IRR 3000ML UROMA (IV SOLUTION) ×3 IMPLANT
WATER STERILE IRR 500ML POUR (IV SOLUTION) ×3 IMPLANT
radioactive seed ×261 IMPLANT

## 2014-06-05 NOTE — Anesthesia Preprocedure Evaluation (Signed)
Anesthesia Evaluation  Patient identified by MRN, date of birth, ID band Patient awake    Reviewed: Allergy & Precautions, H&P , NPO status , Patient's Chart, lab work & pertinent test results  Airway Mallampati: II TM Distance: >3 FB Neck ROM: Full    Dental  (+) Missing, Dental Advisory Given, Poor Dentition   Pulmonary neg pulmonary ROS, former smoker,  breath sounds clear to auscultation  Pulmonary exam normal       Cardiovascular hypertension, Pt. on medications negative cardio ROS  Rhythm:Regular Rate:Normal     Neuro/Psych Orofacial dyskinesia negative psych ROS   GI/Hepatic negative GI ROS, Neg liver ROS,   Endo/Other  negative endocrine ROS  Renal/GU negative Renal ROS   Prostate CA negative genitourinary   Musculoskeletal negative musculoskeletal ROS (+)   Abdominal   Peds  Hematology negative hematology ROS (+)   Anesthesia Other Findings   Reproductive/Obstetrics                           Anesthesia Physical Anesthesia Plan  ASA: II  Anesthesia Plan: General   Post-op Pain Management:    Induction: Intravenous  Airway Management Planned: LMA  Additional Equipment:   Intra-op Plan:   Post-operative Plan: Extubation in OR  Informed Consent: I have reviewed the patients History and Physical, chart, labs and discussed the procedure including the risks, benefits and alternatives for the proposed anesthesia with the patient or authorized representative who has indicated his/her understanding and acceptance.   Dental advisory given  Plan Discussed with: CRNA  Anesthesia Plan Comments:         Anesthesia Quick Evaluation

## 2014-06-05 NOTE — Anesthesia Postprocedure Evaluation (Signed)
  Anesthesia Post-op Note  Patient: Patrick Dudley  Procedure(s) Performed: Procedure(s) (LRB): RADIOACTIVE SEED IMPLANT (N/A) CYSTOSCOPY (N/A)  Patient Location: PACU  Anesthesia Type: General  Level of Consciousness: awake and alert   Airway and Oxygen Therapy: Patient Spontanous Breathing  Post-op Pain: mild  Post-op Assessment: Post-op Vital signs reviewed, Patient's Cardiovascular Status Stable, Respiratory Function Stable, Patent Airway and No signs of Nausea or vomiting  Last Vitals:  Filed Vitals:   06/05/14 1730  BP: 151/81  Pulse: 51  Temp:   Resp: 9    Post-op Vital Signs: stable   Complications: No apparent anesthesia complications

## 2014-06-05 NOTE — Op Note (Addendum)
Preoperative diagnosis: Clinically localized adenocarcinoma of the prostate (T1c Nx Mx)  Postoperative diagnosis: Clinically localized adenocarcinoma of the prostate  Procedure: 1) Transperineal placement of radioactive seeds into the prostate                     2) Cystoscopy  Surgeon: Pryor Curia. M.D.  Radiation oncologist: Dr. Tyler Pita  Anesthesia: General  EBL: Minimal  Complications: None  Indication: Patrick Dudley is a 73 y.o. gentleman with clinically localized prostate cancer. After discussing management options for treatment, he elected to proceed with radiotherapy. He presents today for the above procedures. The potential risks, complications, alternative options, and expected recovery course have been discussed in detail with the patient and he has provided informed consent to proceed.  Description of procedure: The patient was taken to the operating room and general anesthesia was induced. He was administered preoperative antibiotics, placed in the dorsal lithotomy position, and prepped and draped in the usual sterile fashion. Next, intraoperative transrectal ultrasonography was utilized for real-time intraoperative planning by the radiation oncology team. Once the treatment plan was completed, radiation seeds were placed utilizing a brachytherapy perineal template and the robotic Nucletron was utilized to place 87 radioactive iodine 125 seeds into the prostate through 23 needles.  Position of the radiation seeds was confirmed on fluoroscopic imaging.  Flexible cystoscopy was then performed and no seeds were identified within the bladder.  No bladder tumors, stones, or other mucosal pathology was identified within the bladder. A urethral catheter was inserted at the end of the procedure.  He tolerated the procedure well and without complications. He was able to be transferred to the recovery unit in satisfactory condition.

## 2014-06-05 NOTE — Discharge Instructions (Addendum)
°  You will be prescribed tamsulosin which is a medication to help you urinate over the next month.  You should call Dr. Lynne Logan office [phone]) if you feel you cannot empty your bladder well after the catheter is removed. Also, call if you develop fever > 101.  You will also be prescribed pain medication, a stool softener, and an antibiotic to take initially after the procedure.  Followup with Dr. Alinda Money and your radiation oncologist as scheduled. Post Anesthesia Home Care Instructions  Activity: Get plenty of rest for the remainder of the day. A responsible adult should stay with you for 24 hours following the procedure.  For the next 24 hours, DO NOT: -Drive a car -Paediatric nurse -Drink alcoholic beverages -Take any medication unless instructed by your physician -Make any legal decisions or sign important papers.  Meals: Start with liquid foods such as gelatin or soup. Progress to regular foods as tolerated. Avoid greasy, spicy, heavy foods. If nausea and/or vomiting occur, drink only clear liquids until the nausea and/or vomiting subsides. Call your physician if vomiting continues.  Special Instructions/Symptoms: Your throat may feel dry or sore from the anesthesia or the breathing tube placed in your throat during surgery. If this causes discomfort, gargle with warm salt water. The discomfort should disappear within 24 hours.

## 2014-06-05 NOTE — Anesthesia Procedure Notes (Signed)
Procedure Name: LMA Insertion Date/Time: 06/05/2014 2:52 PM Performed by: Bethena Roys T Pre-anesthesia Checklist: Patient identified, Emergency Drugs available, Suction available and Patient being monitored Patient Re-evaluated:Patient Re-evaluated prior to inductionOxygen Delivery Method: Circle System Utilized Preoxygenation: Pre-oxygenation with 100% oxygen Intubation Type: IV induction Ventilation: Mask ventilation without difficulty LMA: LMA inserted LMA Size: 5.0 Number of attempts: 1 Airway Equipment and Method: bite block Placement Confirmation: positive ETCO2 Dental Injury: Teeth and Oropharynx as per pre-operative assessment

## 2014-06-05 NOTE — Transfer of Care (Signed)
Immediate Anesthesia Transfer of Care Note  Patient: Patrick Dudley  Procedure(s) Performed: Procedure(s): RADIOACTIVE SEED IMPLANT (N/A) CYSTOSCOPY (N/A)  Patient Location: PACU  Anesthesia Type:General  Level of Consciousness: sedated and responds to stimulation  Airway & Oxygen Therapy: Patient Spontanous Breathing and Patient connected to nasal cannula oxygen  Post-op Assessment: Report given to PACU RN  Post vital signs: Reviewed and stable  Complications: No apparent anesthesia complications

## 2014-06-06 ENCOUNTER — Encounter (HOSPITAL_BASED_OUTPATIENT_CLINIC_OR_DEPARTMENT_OTHER): Payer: Self-pay | Admitting: Urology

## 2014-06-06 NOTE — Procedures (Signed)
  Radiation Oncology         [phone] ________________________________  Name: Patrick Dudley MRN: [id number]  Date: 06/06/2014  DOB: 11-03-1941       Prostate Seed Implant  NO:[facility] Jenny Reichmann, MD  No ref. provider found  DIAGNOSIS: 73 y.o. gentleman with stage T1c adenocarcinoma of the prostate with a Gleason's score of 3+4 and a PSA of 4.59  PROCEDURE: Insertion of radioactive I-125 seeds into the prostate gland.  RADIATION DOSE: 145 Gy, definitive therapy.  TECHNIQUE: TRAJAN GROVE was brought to the operating room with the urologist. He was placed in the dorsolithotomy position. He was catheterized and a rectal tube was inserted. The perineum was shaved, prepped and draped. The ultrasound probe was then introduced into the rectum to see the prostate gland.  TREATMENT DEVICE: A needle grid was attached to the ultrasound probe stand and anchor needles were placed.  3D PLANNING: The prostate was imaged in 3D using a sagittal sweep of the prostate probe. These images were transferred to the planning computer. There, the prostate, urethra and rectum were defined on each axial reconstructed image. Then, the software created an optimized 3D plan and a few seed positions were adjusted. The quality of the plan was reviewed using [facility] information for the target and the following two organs at risk:  Urethra and Rectum.  Then the accepted plan was uploaded to the seed Selectron afterloading unit.  PROSTATE VOLUME STUDY:  Using transrectal ultrasound the volume of the prostate was verified to be 56.11 cc.  SPECIAL TREATMENT PROCEDURE/SUPERVISION AND HANDLING: The Nucletron FIRST system was used to place the needles under sagittal guidance. A 2total of 25 needles were used to deposit 87 seeds in the prostate gland. The individual seed activity was 0.502 mCi for a total implant activity of 43.674 mCi.  COMPLEX SIMULATION: At the end of the procedure, an anterior radiograph of the pelvis was obtained  to document seed positioning and count. Cystoscopy was performed to check the urethra and bladder.  MICRODOSIMETRY: At the end of the procedure, the patient was emitting 0.31 mrem/hr at 1 meter. Accordingly, he was considered safe for hospital discharge.  PLAN: The patient will return to the radiation oncology clinic for post implant CT dosimetry in three weeks.   ________________________________  Sheral Apley Tammi Klippel, M.D.

## 2014-07-09 ENCOUNTER — Telehealth: Payer: Self-pay | Admitting: *Deleted

## 2014-07-09 ENCOUNTER — Encounter: Payer: Self-pay | Admitting: Radiation Oncology

## 2014-07-09 NOTE — Progress Notes (Signed)
Radiation Oncology         [phone]) [phone] ________________________________  Name: Patrick Dudley MRN: [id number]  Date: 07/10/2014  DOB: May 28, 1941  Follow-Up Visit Note  CC: Cathlean Cower, MD  Raynelle Bring, MD  Diagnosis:   73 y.o. gentleman with stage T1c adenocarcinoma of the prostate with a Gleason's score of 3+4 and a PSA of 4.59  Interval Since Last Radiation:  4  weeks  Narrative:  The patient returns today for routine follow-up.  He is complaining of increased urinary frequency and urinary hesitation symptoms. He filled out a questionnaire regarding urinary function today providing and overall IPSS score of 5 characterizing his symptoms as mild.  His pre-implant score was 2. He denies any bowel symptoms.  ALLERGIES:  has No Known Allergies.  Meds: Current Outpatient Prescriptions  Medication Sig Dispense Refill  . amLODipine (NORVASC) 10 MG tablet Take 10 mg by mouth every morning.       Marland Kitchen aspirin 81 MG tablet Take 81 mg by mouth daily.      . clonazePAM (KLONOPIN) 0.5 MG tablet Take 0.5 mg by mouth 3 (three) times daily.       . mirtazapine (REMERON) 15 MG tablet Take 15 mg by mouth at bedtime.      . Misc Natural Products (PROSTATE SUPPORT PO) Take 1 capsule by mouth daily.       Marland Kitchen HYDROcodone-acetaminophen (NORCO/VICODIN) 5-325 MG per tablet Take 1-2 tablets by mouth every 6 (six) hours as needed.  25 tablet  0  . Omega-3 Fatty Acids (FISH OIL) 1000 MG CAPS Take 1 capsule by mouth daily.       . tamsulosin (FLOMAX) 0.4 MG CAPS capsule Take 1 capsule (0.4 mg total) by mouth at bedtime.  30 capsule  0   No current facility-administered medications for this encounter.    Physical Findings: The patient is in no acute distress. Patient is alert and oriented.  weight is 125 lb (56.7 kg). His blood pressure is 151/81 and his pulse is 57. His respiration is 16. .  No significant changes.  Lab Findings: Lab Results  Component Value Date   WBC 6.6 05/29/2014   HGB 14.3 05/29/2014    HCT 42.3 05/29/2014   MCV 92.8 05/29/2014   PLT 246 05/29/2014    Radiographic Findings:  Patient underwent CT imaging in our clinic for post implant dosimetry. The CT appears to demonstrate an adequate distribution of radioactive seeds throughout the prostate gland. There no seeds in her near the rectum. I suspect the final radiation plan and dosimetry will show appropriate coverage of the prostate gland.   Impression: The patient is recovering from the effects of radiation. His urinary symptoms should gradually improve over the next 4-6 months. We talked about this today. He is encouraged by his improvement already and is otherwise please with his outcome.   Plan: Today, I spent time talking to the patient about his prostate seed implant and resolving urinary symptoms. We also talked about long-term follow-up for prostate cancer following seed implant. He understands that ongoing PSA determinations and digital rectal exams will help perform surveillance to rule out disease recurrence. He understands what to expect with his PSA measures. Patient was also educated today about some of the long-term effects from radiation including a small risk for rectal bleeding and possibly erectile dysfunction. We talked about some of the general management approaches to these potential complications. However, I did encourage the patient to contact our office or return at  any point if he has questions or concerns related to his previous radiation and prostate cancer.  _____________________________________  Sheral Apley. Tammi Klippel, M.D.

## 2014-07-09 NOTE — Telephone Encounter (Signed)
Called patient to remind of appts. For 07-10-14, lvm for a return call

## 2014-07-10 ENCOUNTER — Ambulatory Visit
Admission: RE | Admit: 2014-07-10 | Discharge: 2014-07-10 | Disposition: A | Discharge status: Home / Self Care | Payer: Medicare Other | Source: Ambulatory Visit | Attending: Radiation Oncology | Admitting: Radiation Oncology

## 2014-07-10 ENCOUNTER — Ambulatory Visit
Admit: 2014-07-10 | Discharge: 2014-07-10 | Disposition: A | Discharge status: Home / Self Care | Payer: Medicare Other | Attending: Radiation Oncology | Admitting: Radiation Oncology

## 2014-07-10 ENCOUNTER — Encounter: Payer: Self-pay | Admitting: Radiation Oncology

## 2014-07-10 ENCOUNTER — Other Ambulatory Visit: Payer: Self-pay | Admitting: Radiation Oncology

## 2014-07-10 VITALS — BP 151/81 | HR 57 | Resp 16 | Wt 125.0 lb

## 2014-07-10 DIAGNOSIS — C61 Malignant neoplasm of prostate: Secondary | ICD-10-CM

## 2014-07-10 DIAGNOSIS — Z510 Encounter for antineoplastic radiation therapy: Secondary | ICD-10-CM | POA: Diagnosis present

## 2014-07-10 NOTE — Progress Notes (Signed)
Weight and vitals stable. IPSS 5. Reports he feels great. Stopped taking flomax because "he doesn't need it anymore." Denies urgency, frequency, dysuria or hematuria. Denies difficulty emptying his bladder. Denies diarrhea. Denies pain or fatigue.

## 2014-07-10 NOTE — Progress Notes (Signed)
  Radiation Oncology         [phone] ________________________________  Name: Patrick Dudley MRN: [id number]  Date: 07/10/2014  DOB: 03-15-1941  COMPLEX SIMULATION NOTE  NARRATIVE:  The patient was brought to the [facility] today following prostate seed implantation approximately one month ago.  Identity was confirmed.  All relevant records and images related to the planned course of therapy were reviewed.  Then, the patient was set-up supine.  CT images were obtained.  The CT images were loaded into the planning software.  Then the prostate and rectum were contoured.  Treatment planning then occurred.  The implanted iodine 125 seeds were identified by the physics staff for projection of radiation distribution  I have requested : 3D Simulation  I have requested a DVH of the following structures: Prostate and rectum.    ________________________________  Sheral Apley Tammi Klippel, M.D.

## 2014-09-16 ENCOUNTER — Encounter: Payer: Self-pay | Admitting: Radiation Oncology

## 2014-09-16 DIAGNOSIS — Z510 Encounter for antineoplastic radiation therapy: Secondary | ICD-10-CM | POA: Diagnosis not present

## 2014-09-21 NOTE — Progress Notes (Signed)
  Radiation Oncology         [phone] ________________________________  Name: Patrick Dudley MRN: [id number]  Date: 09/16/2014  DOB: [birth year]  Complex Isodose Planning Note Prostate Brachytherapy  Diagnosis: 73 y.o. gentleman with stage T1c adenocarcinoma of the prostate with a Gleason's score of 3+4 and a PSA of 4.59  Narrative: On a previous date, Patrick Dudley returned following prostate seed implantation for post implant planning. He underwent CT scan complex simulation to delineate the three-dimensional structures of the pelvis and demonstrate the radiation distribution.  Since that time, the seed localization, and complex isodose planning with dose volume histograms have now been completed.  Results:   Prostate Coverage - The dose of radiation delivered to the 90% or more of the prostate gland (D90) was 132.81% of the prescription dose. This exceeds our goal of greater than 90%. Rectal Sparing - The volume of rectal tissue receiving the prescription dose or higher was 0.12 cc. This falls under our thresholds tolerance of 1.0 cc.  Impression: The prostate seed implant appears to show adequate target coverage and appropriate rectal sparing.  Plan:  The patient will continue to follow with urology for ongoing PSA determinations. I would anticipate a high likelihood for local tumor control with minimal risk for rectal morbidity.  ________________________________  Sheral Apley Tammi Klippel, M.D.

## 2015-07-24 ENCOUNTER — Encounter: Payer: Self-pay | Admitting: Gastroenterology

## 2015-09-07 ENCOUNTER — Other Ambulatory Visit (HOSPITAL_COMMUNITY): Payer: Self-pay | Admitting: Pulmonary Disease

## 2015-09-07 DIAGNOSIS — E041 Nontoxic single thyroid nodule: Secondary | ICD-10-CM

## 2015-09-10 ENCOUNTER — Ambulatory Visit (HOSPITAL_COMMUNITY)
Admission: RE | Admit: 2015-09-10 | Discharge: 2015-09-10 | Disposition: A | Discharge status: Home / Self Care | Payer: Medicare Other | Source: Ambulatory Visit | Attending: Pulmonary Disease | Admitting: Pulmonary Disease

## 2015-09-10 DIAGNOSIS — E041 Nontoxic single thyroid nodule: Secondary | ICD-10-CM | POA: Insufficient documentation

## 2016-04-13 DIAGNOSIS — G244 Idiopathic orofacial dystonia: Secondary | ICD-10-CM

## 2016-04-13 HISTORY — DX: Idiopathic orofacial dystonia: G24.4

## 2017-01-28 IMAGING — US US SOFT TISSUE HEAD/NECK
1 series · 14 of 25 positions shown · non-contrast
Comparison: None.

CLINICAL DATA: Thyroid nodule on recent physical exam

EXAM:
THYROID ULTRASOUND
TECHNIQUE: Ultrasound examination of the thyroid gland and adjacent soft
tissues was performed.

[Series 1: us soft tissue head/neck · 0.05mm/px · 14 of 36 slices shown]
[im 1/36]
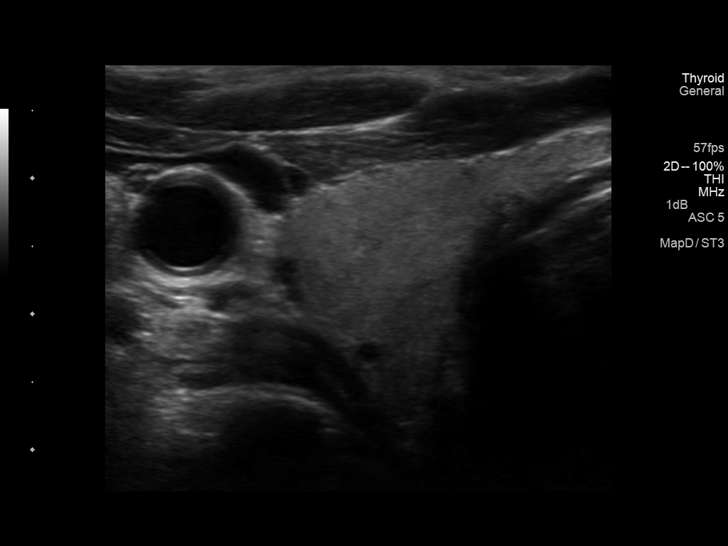
[im 3/36]
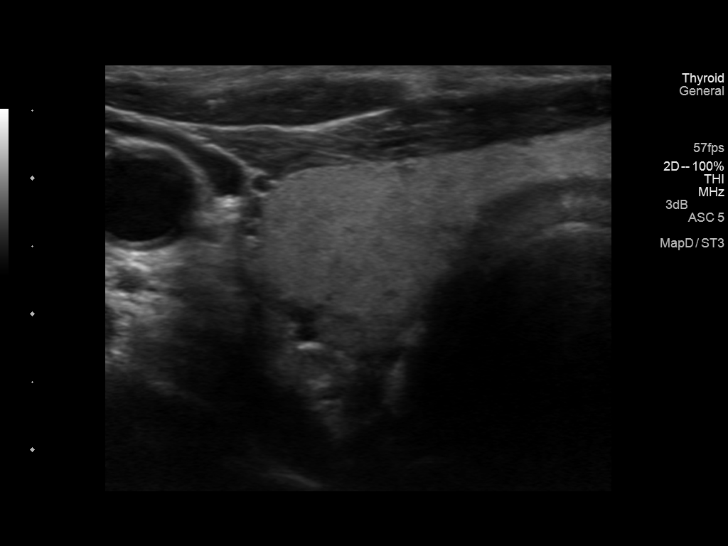
[im 6/36]
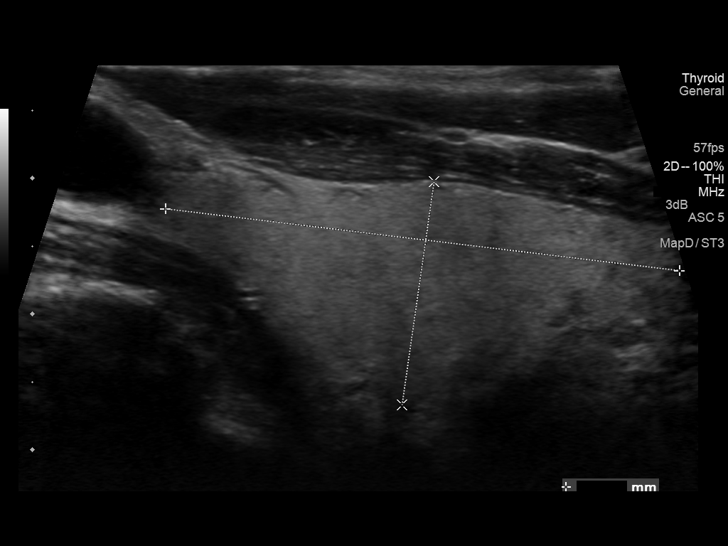
[im 9/36]
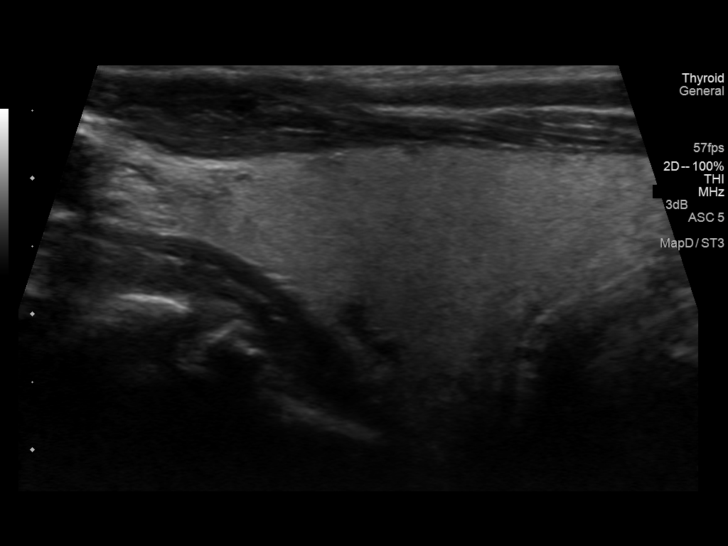
[im 12/36]
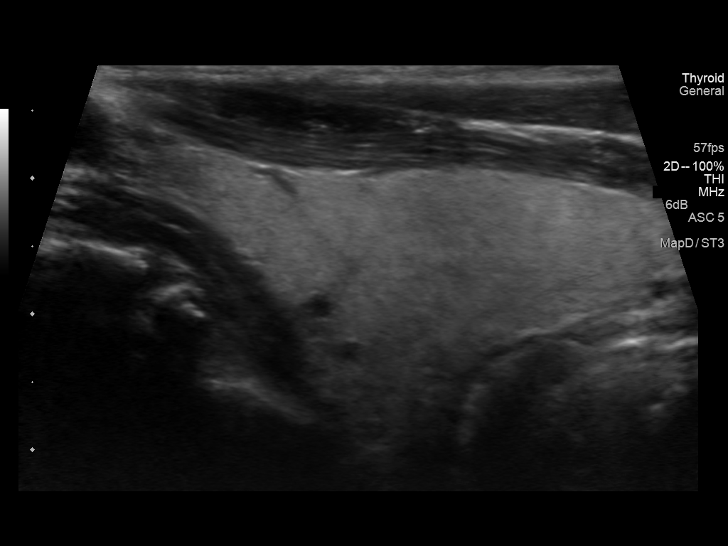
[im 14/36]
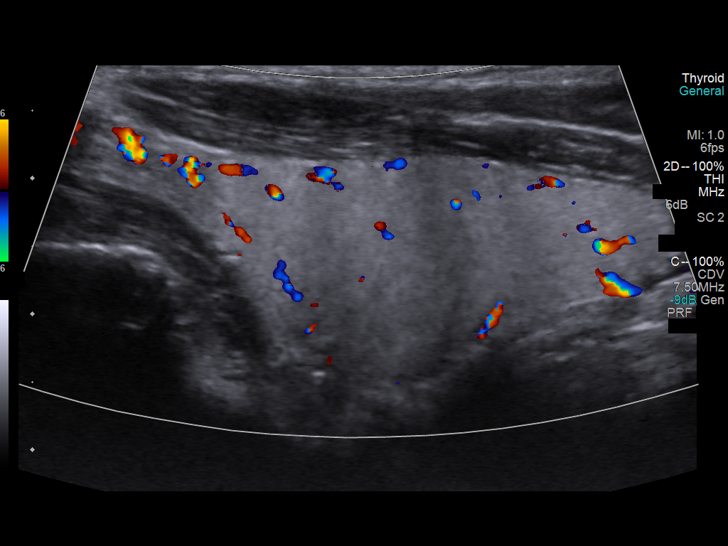
[im 17/36]
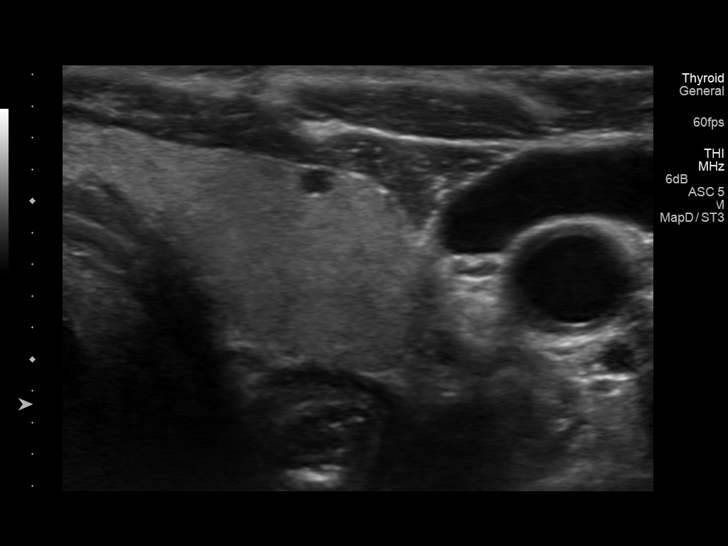
[im 19/36]
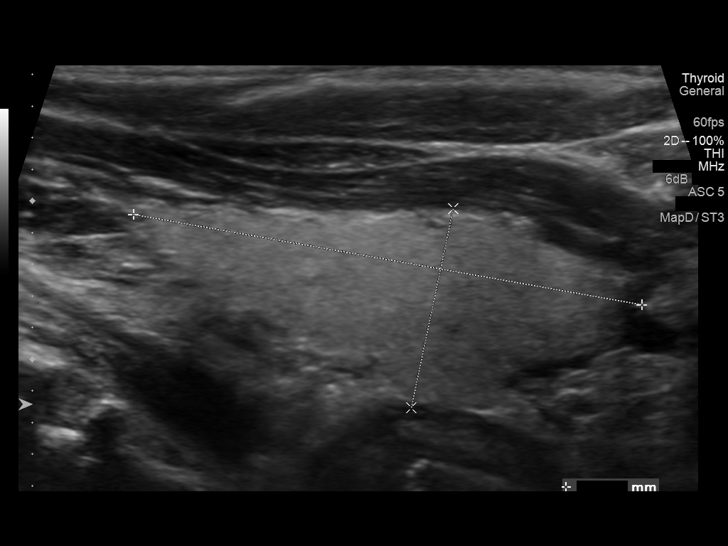
[im 22/36]
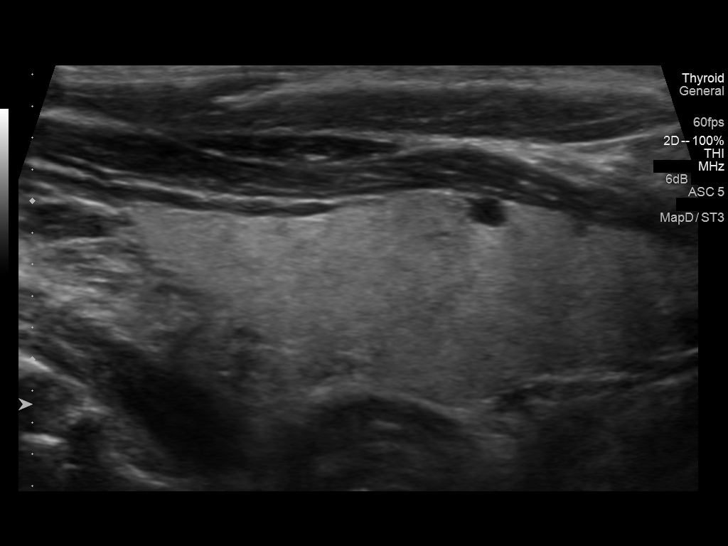
[im 24/36]
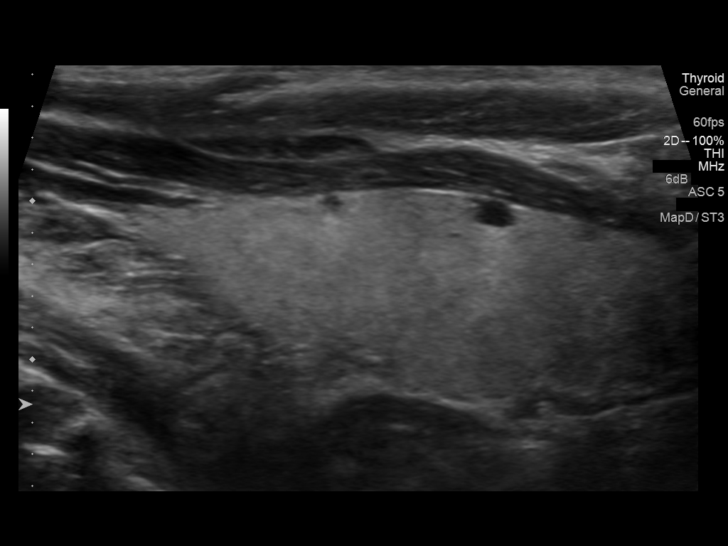
[im 27/36]
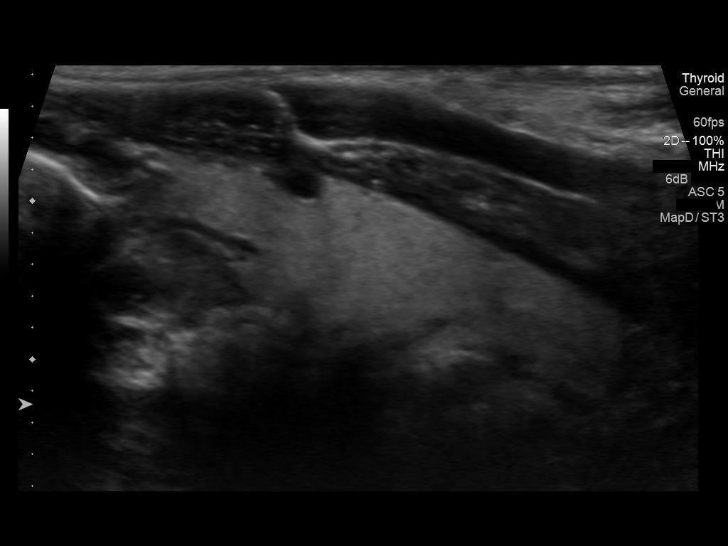
[im 30/36]
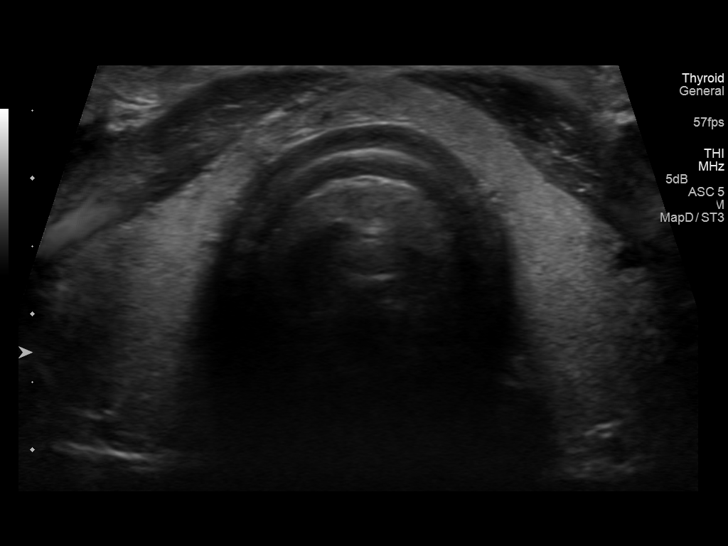
[im 33/36]
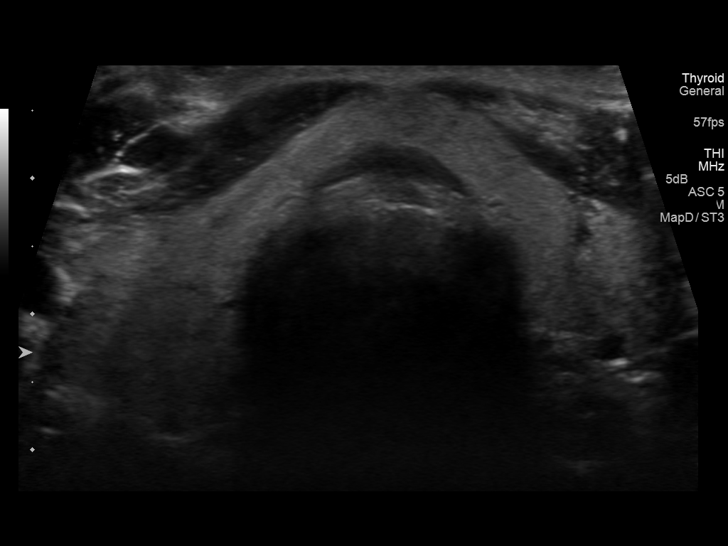
[im 36/36]
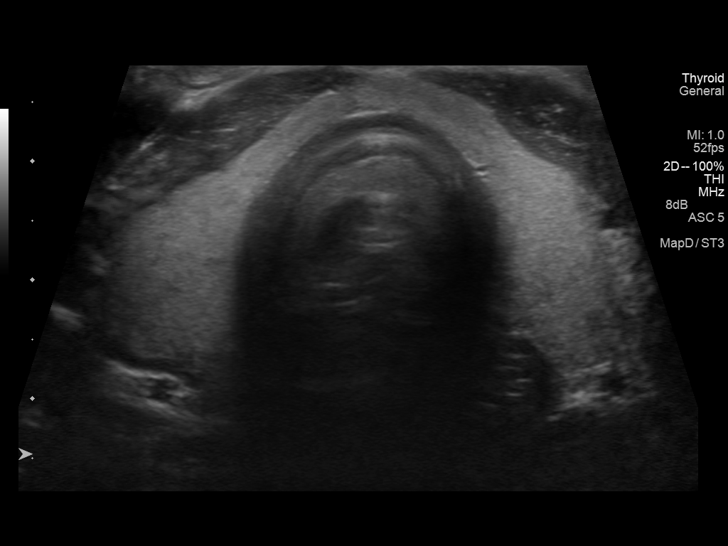

[14 of 25 positions shown; findings below may reference images not displayed]

FINDINGS: Right thyroid lobe

Measurements: 38 x 17 x 16 mm.  No nodules visualized.

Left thyroid lobe

Measurements: 33 x 13 x 17 mm.  No nodules visualized.

Isthmus

Thickness: 4 mm.  No nodules visualized.

Lymphadenopathy

None visualized.
IMPRESSION: Normal

## 2017-03-13 DIAGNOSIS — E46 Unspecified protein-calorie malnutrition: Secondary | ICD-10-CM | POA: Diagnosis not present

## 2017-03-13 DIAGNOSIS — C61 Malignant neoplasm of prostate: Secondary | ICD-10-CM | POA: Diagnosis not present

## 2017-03-13 DIAGNOSIS — R490 Dysphonia: Secondary | ICD-10-CM | POA: Diagnosis not present

## 2017-03-13 DIAGNOSIS — I10 Essential (primary) hypertension: Secondary | ICD-10-CM | POA: Diagnosis not present

## 2017-03-14 DIAGNOSIS — R69 Illness, unspecified: Secondary | ICD-10-CM | POA: Diagnosis not present

## 2017-03-16 DIAGNOSIS — C61 Malignant neoplasm of prostate: Secondary | ICD-10-CM | POA: Diagnosis not present

## 2017-03-22 DIAGNOSIS — N5201 Erectile dysfunction due to arterial insufficiency: Secondary | ICD-10-CM | POA: Diagnosis not present

## 2017-03-22 DIAGNOSIS — C61 Malignant neoplasm of prostate: Secondary | ICD-10-CM | POA: Diagnosis not present

## 2017-04-12 DIAGNOSIS — Z79899 Other long term (current) drug therapy: Secondary | ICD-10-CM | POA: Diagnosis not present

## 2017-04-12 DIAGNOSIS — R4781 Slurred speech: Secondary | ICD-10-CM | POA: Diagnosis not present

## 2017-04-12 DIAGNOSIS — G244 Idiopathic orofacial dystonia: Secondary | ICD-10-CM | POA: Diagnosis not present

## 2017-06-06 DIAGNOSIS — H40003 Preglaucoma, unspecified, bilateral: Secondary | ICD-10-CM | POA: Diagnosis not present

## 2017-06-06 DIAGNOSIS — H25813 Combined forms of age-related cataract, bilateral: Secondary | ICD-10-CM | POA: Diagnosis not present

## 2017-09-08 DIAGNOSIS — C61 Malignant neoplasm of prostate: Secondary | ICD-10-CM | POA: Diagnosis not present

## 2017-09-14 DIAGNOSIS — N522 Drug-induced erectile dysfunction: Secondary | ICD-10-CM | POA: Diagnosis not present

## 2017-09-14 DIAGNOSIS — C61 Malignant neoplasm of prostate: Secondary | ICD-10-CM | POA: Diagnosis not present

## 2017-09-14 DIAGNOSIS — Z0000 Encounter for general adult medical examination without abnormal findings: Secondary | ICD-10-CM | POA: Diagnosis not present

## 2017-09-14 DIAGNOSIS — I10 Essential (primary) hypertension: Secondary | ICD-10-CM | POA: Diagnosis not present

## 2017-09-14 DIAGNOSIS — I348 Other nonrheumatic mitral valve disorders: Secondary | ICD-10-CM | POA: Diagnosis not present

## 2017-09-14 DIAGNOSIS — Z23 Encounter for immunization: Secondary | ICD-10-CM | POA: Diagnosis not present

## 2017-09-14 DIAGNOSIS — E46 Unspecified protein-calorie malnutrition: Secondary | ICD-10-CM | POA: Diagnosis not present

## 2017-09-14 DIAGNOSIS — R490 Dysphonia: Secondary | ICD-10-CM | POA: Diagnosis not present

## 2017-09-14 DIAGNOSIS — E042 Nontoxic multinodular goiter: Secondary | ICD-10-CM | POA: Diagnosis not present

## 2017-09-15 DIAGNOSIS — N5201 Erectile dysfunction due to arterial insufficiency: Secondary | ICD-10-CM | POA: Diagnosis not present

## 2017-09-15 DIAGNOSIS — C61 Malignant neoplasm of prostate: Secondary | ICD-10-CM | POA: Diagnosis not present

## 2017-09-18 DIAGNOSIS — R69 Illness, unspecified: Secondary | ICD-10-CM | POA: Diagnosis not present

## 2017-09-27 DIAGNOSIS — Z1211 Encounter for screening for malignant neoplasm of colon: Secondary | ICD-10-CM | POA: Diagnosis not present

## 2017-10-12 DIAGNOSIS — Z79899 Other long term (current) drug therapy: Secondary | ICD-10-CM | POA: Diagnosis not present

## 2017-10-12 DIAGNOSIS — G244 Idiopathic orofacial dystonia: Secondary | ICD-10-CM | POA: Diagnosis not present

## 2017-12-12 DIAGNOSIS — H40003 Preglaucoma, unspecified, bilateral: Secondary | ICD-10-CM | POA: Diagnosis not present

## 2017-12-12 DIAGNOSIS — H2513 Age-related nuclear cataract, bilateral: Secondary | ICD-10-CM | POA: Diagnosis not present

## 2018-02-06 ENCOUNTER — Encounter: Payer: Self-pay | Admitting: Gastroenterology

## 2018-03-15 ENCOUNTER — Encounter (INDEPENDENT_AMBULATORY_CARE_PROVIDER_SITE_OTHER): Payer: Self-pay | Admitting: *Deleted

## 2018-03-15 DIAGNOSIS — I10 Essential (primary) hypertension: Secondary | ICD-10-CM | POA: Diagnosis not present

## 2018-03-15 DIAGNOSIS — E46 Unspecified protein-calorie malnutrition: Secondary | ICD-10-CM | POA: Diagnosis not present

## 2018-03-15 DIAGNOSIS — C61 Malignant neoplasm of prostate: Secondary | ICD-10-CM | POA: Diagnosis not present

## 2018-03-15 DIAGNOSIS — R490 Dysphonia: Secondary | ICD-10-CM | POA: Diagnosis not present

## 2018-03-16 DIAGNOSIS — C61 Malignant neoplasm of prostate: Secondary | ICD-10-CM | POA: Diagnosis not present

## 2018-03-23 ENCOUNTER — Encounter: Payer: Self-pay | Admitting: Gastroenterology

## 2018-03-23 DIAGNOSIS — C61 Malignant neoplasm of prostate: Secondary | ICD-10-CM | POA: Diagnosis not present

## 2018-03-23 DIAGNOSIS — N5201 Erectile dysfunction due to arterial insufficiency: Secondary | ICD-10-CM | POA: Diagnosis not present

## 2018-03-26 DIAGNOSIS — R69 Illness, unspecified: Secondary | ICD-10-CM | POA: Diagnosis not present

## 2018-04-12 DIAGNOSIS — G244 Idiopathic orofacial dystonia: Secondary | ICD-10-CM | POA: Diagnosis not present

## 2018-04-26 DIAGNOSIS — R69 Illness, unspecified: Secondary | ICD-10-CM | POA: Diagnosis not present

## 2018-05-08 ENCOUNTER — Ambulatory Visit (INDEPENDENT_AMBULATORY_CARE_PROVIDER_SITE_OTHER): Payer: Medicare HMO | Admitting: Gastroenterology

## 2018-05-08 ENCOUNTER — Encounter: Payer: Self-pay | Admitting: Gastroenterology

## 2018-05-08 VITALS — BP 130/74 | HR 76 | Ht 69.5 in | Wt 121.4 lb

## 2018-05-08 DIAGNOSIS — R54 Age-related physical debility: Secondary | ICD-10-CM | POA: Diagnosis not present

## 2018-05-08 DIAGNOSIS — Z1211 Encounter for screening for malignant neoplasm of colon: Secondary | ICD-10-CM | POA: Diagnosis not present

## 2018-05-08 MED ORDER — SUPREP BOWEL PREP KIT 17.5-3.13-1.6 GM/177ML PO SOLN: ORAL | 0 refills | Status: DC

## 2018-05-08 NOTE — Patient Instructions (Addendum)
If you are age 76 or older, your body mass index should be between 23-30. Your Body mass index is 17.67 kg/m. If this is out of the aforementioned range listed, please consider follow up with your Primary Care Provider.  If you are age 20 or younger, your body mass index should be between 19-25. Your Body mass index is 17.67 kg/m. If this is out of the aformentioned range listed, please consider follow up with your Primary Care Provider.   You have been scheduled for a colonoscopy. Please follow written instructions given to you at your visit today.  Please pick up your prep supplies at the pharmacy within the next 1-3 days. If you use inhalers (even only as needed), please bring them with you on the day of your procedure. Your physician has requested that you go to www.startemmi.com and enter the access code given to you at your visit today. This web site gives a general overview about your procedure. However, you should still follow specific instructions given to you by our office regarding your preparation for the procedure.   Thank you for entrusting me with your care and for choosing [facility], Dr. Gaylord Cellar

## 2018-05-08 NOTE — Progress Notes (Signed)
HPI :  77 year old male with a history of hypertension, hyperlipidemia, prostate cancer, here to discuss options for colon cancer screening.  He had a colonoscopy in 2009 which showed some hyperplastic polyps in the sigmoid colon.no history of adenomas. He denies any changes in his bowel habits. No blood in his stools. No abdominal pains. Is eating well. No nausea or vomiting. Weight stable.no dysphagia, no reflux symptoms of bothers him. He has had radiation therapy, seed placement for treatment of prostate cancer in 2015. There is no family history of colon cancer. We discussed whether or not he wanted further colon cancer screening given his age.   Colonoscopy 03/10/2008 - sigmoid hyperplastic polyp  Past Medical History:  Diagnosis Date  . Colon polyps   . Depression   . Dyslipidemia   . History of glaucoma    S/P   LASER [place]  2003  . History of kidney stones   . Hypertension   . Lymphocytosis (symptomatic)   . Orofacial dyskinesia   . Prostate cancer ([place]) DX  2009   GLEASON  3+4,  T1c,  PSA  4.59     Past Surgical History:  Procedure Laterality Date  . COLONOSCOPY  2007  . CYSTOSCOPY N/A 06/05/2014   Procedure: CYSTOSCOPY;  Surgeon: Raynelle Bring, MD;  Location: [facility];  Service: Urology;  Laterality: N/A;  . PROSTATE BIOPSY  MULTIPLE-  LAST ONE  03-11-2014  . RADIOACTIVE SEED IMPLANT N/A 06/05/2014   Procedure: RADIOACTIVE SEED IMPLANT;  Surgeon: Raynelle Bring, MD;  Location: [facility];  Service: Urology;  Laterality: N/A;  . REFRACTIVE SURGERY Left 2003   GLAUCOMA  . URETEROLITHOTOMY  1980   Family History  Problem Relation Age of Onset  . Prostate cancer Father   . Nephrolithiasis Brother   . Prostate cancer Brother   . Diabetes Sister   . Heart disease Sister   . Diabetes Brother   . Heart disease Brother   . Heart disease Brother    Social History   Tobacco Use  . Smoking status: Former Smoker    Packs/day: 0.50   Years: 18.00    Pack years: 9.00    Types: Cigarettes    Last attempt to quit: 12/26/1981    Years since quitting: 36.3  . Smokeless tobacco: Never Used  Substance Use Topics  . Alcohol use: No  . Drug use: No   Current Outpatient Medications  Medication Sig Dispense Refill  . amLODipine (NORVASC) 10 MG tablet Take 10 mg by mouth every morning.     Marland Kitchen aspirin 81 MG tablet Take 81 mg by mouth daily.    . clonazePAM (KLONOPIN) 0.5 MG tablet Take 0.5 mg by mouth 3 (three) times daily.     . mirtazapine (REMERON) 15 MG tablet Take 15 mg by mouth at bedtime.     No current facility-administered medications for this visit.    No Known Allergies   Review of Systems: All systems reviewed and negative except where noted in HPI.   Lab Results  Component Value Date   WBC 6.6 05/29/2014   HGB 14.3 05/29/2014   HCT 42.3 05/29/2014   MCV 92.8 05/29/2014   PLT 246 05/29/2014    Lab Results  Component Value Date   ALT 11 05/29/2014   AST 21 05/29/2014   ALKPHOS 63 05/29/2014   BILITOT 1.7 (H) 05/29/2014    Lab Results  Component Value Date   CREATININE 0.71 05/29/2014   BUN 10  05/29/2014   NA 141 05/29/2014   K 4.3 05/29/2014   CL 100 05/29/2014   CO2 32 05/29/2014     Physical Exam: BP 130/74 (BP Location: Left Arm, Patient Position: Sitting, Cuff Size: Normal)   Pulse 76   Ht 5' 9.5" (1.765 m) Comment: height measured without shoes  Wt 121 lb 6 oz (55.1 kg)   BMI 17.67 kg/m  Constitutional: Pleasant,well-developed, male in no acute distress. HEENT: Normocephalic and atraumatic. Conjunctivae are normal. No scleral icterus. Neck supple.  Cardiovascular: Normal rate, regular rhythm.  Pulmonary/chest: Effort normal and breath sounds normal. No wheezing, rales or rhonchi. Abdominal: Soft, nondistended, nontender. There are no masses palpable. No hepatomegaly. Extremities: no edema Lymphadenopathy: No cervical adenopathy noted. Neurological: Alert and oriented to person  place and time. Skin: Skin is warm and dry. No rashes noted. Psychiatric: Normal mood and affect. Behavior is normal.   ASSESSMENT AND PLAN: 77 year old male healthy male with prostate cancer in remission, here to discuss colon cancer screening options and if he wishes to have any further colon cancer screening in general given his age.  We discussed that guidelines suggest stopping colon cancer screening around age 35 for average risk patients, although this decision is individualized and based on several factors. He is otherwise healthy with a good life expectancy at this time. We discussed colon cancer screening options to include stool based as is and colonoscopy. Following this discussion, despite his age, he feels well and wants to have another colonoscopy. If this is normal then no further screening will be recommended. He agreed with the plan as outlined.  [place] Cellar, MD [facility]

## 2018-05-15 ENCOUNTER — Telehealth: Payer: Self-pay | Admitting: Gastroenterology

## 2018-05-15 NOTE — Telephone Encounter (Signed)
Called [facility].  Suprep would be $99.  Sent no more than $50 card.

## 2018-06-04 DIAGNOSIS — D485 Neoplasm of uncertain behavior of skin: Secondary | ICD-10-CM | POA: Diagnosis not present

## 2018-06-04 DIAGNOSIS — B079 Viral wart, unspecified: Secondary | ICD-10-CM | POA: Diagnosis not present

## 2018-06-04 DIAGNOSIS — L821 Other seborrheic keratosis: Secondary | ICD-10-CM | POA: Diagnosis not present

## 2018-06-04 DIAGNOSIS — L300 Nummular dermatitis: Secondary | ICD-10-CM | POA: Diagnosis not present

## 2018-06-12 DIAGNOSIS — H40003 Preglaucoma, unspecified, bilateral: Secondary | ICD-10-CM | POA: Diagnosis not present

## 2018-06-14 ENCOUNTER — Encounter: Payer: Self-pay | Admitting: Gastroenterology

## 2018-06-27 ENCOUNTER — Other Ambulatory Visit: Payer: Self-pay

## 2018-06-27 ENCOUNTER — Ambulatory Visit (AMBULATORY_SURGERY_CENTER): Payer: Medicare HMO | Admitting: Gastroenterology

## 2018-06-27 ENCOUNTER — Encounter: Payer: Self-pay | Admitting: Gastroenterology

## 2018-06-27 VITALS — BP 143/82 | HR 58 | Temp 97.8°F | Resp 15 | Ht 69.0 in | Wt 121.0 lb

## 2018-06-27 DIAGNOSIS — Z8546 Personal history of malignant neoplasm of prostate: Secondary | ICD-10-CM | POA: Diagnosis not present

## 2018-06-27 DIAGNOSIS — I10 Essential (primary) hypertension: Secondary | ICD-10-CM | POA: Diagnosis not present

## 2018-06-27 DIAGNOSIS — D122 Benign neoplasm of ascending colon: Secondary | ICD-10-CM

## 2018-06-27 DIAGNOSIS — D124 Benign neoplasm of descending colon: Secondary | ICD-10-CM | POA: Diagnosis not present

## 2018-06-27 DIAGNOSIS — D123 Benign neoplasm of transverse colon: Secondary | ICD-10-CM

## 2018-06-27 DIAGNOSIS — Z1211 Encounter for screening for malignant neoplasm of colon: Secondary | ICD-10-CM

## 2018-06-27 DIAGNOSIS — D120 Benign neoplasm of cecum: Secondary | ICD-10-CM | POA: Diagnosis not present

## 2018-06-27 MED ORDER — SODIUM CHLORIDE 0.9 % IV SOLN: 500.0000 mL | Freq: Once | INTRAVENOUS | Status: DC

## 2018-06-27 NOTE — Progress Notes (Signed)
Called to room to assist during endoscopic procedure.  Patient ID and intended procedure confirmed with present staff. Received instructions for my participation in the procedure from the performing physician.  

## 2018-06-27 NOTE — Op Note (Signed)
[facility] Patient Name: Patrick Dudley Procedure Date: 06/27/2018 1:41 PM MRN: [id number] Endoscopist: Remo Lipps P. Havery Moros , MD Age: 77 Referring MD:  Date of Birth: September 16, 1941 Gender: Male Account #: [id number] Procedure:                Colonoscopy Indications:              Screening for colorectal malignant neoplasm Medicines:                Monitored Anesthesia Care Procedure:                Pre-Anesthesia Assessment:                           - Prior to the procedure, a History and Physical                            was performed, and patient medications and                            allergies were reviewed. The patient's tolerance of                            previous anesthesia was also reviewed. The risks                            and benefits of the procedure and the sedation                            options and risks were discussed with the patient.                            All questions were answered, and informed consent                            was obtained. Prior Anticoagulants: The patient has                            taken no previous anticoagulant or antiplatelet                            agents. ASA Grade Assessment: III - A patient with                            severe systemic disease. After reviewing the risks                            and benefits, the patient was deemed in                            satisfactory condition to undergo the procedure.                           After obtaining informed consent, the colonoscope  was passed under direct vision. Throughout the                            procedure, the patient's blood pressure, pulse, and                            oxygen saturations were monitored continuously. The                            Model PCF-H190DL [id number]) scope was introduced                            through the anus and advanced to the the cecum,                            identified  by appendiceal orifice and ileocecal                            valve. The colonoscopy was performed without                            difficulty. The patient tolerated the procedure                            well. The quality of the bowel preparation was                            good. The ileocecal valve, appendiceal orifice, and                            rectum were photographed. Scope In: 1:45:12 PM Scope Out: 2:10:13 PM Scope Withdrawal Time: 0 hours 20 minutes 13 seconds  Total Procedure Duration: 0 hours 25 minutes 1 second  Findings:                 The perianal and digital rectal examinations were                            normal.                           The colon was tortuous with significant looping.                           Two sessile polyps were found in the cecum. The                            polyps were diminutive in size. These polyps were                            removed with a cold biopsy forceps. Resection and                            retrieval were complete.  A diminutive polyp was found in the ascending                            colon. The polyp was sessile. The polyp was removed                            with a cold biopsy forceps. Resection and retrieval                            were complete.                           A diminutive polyp was found in the transverse                            colon. The polyp was sessile. The polyp was removed                            with a cold biopsy forceps. Resection and retrieval                            were complete.                           A 7 to 8 mm polyp was found in the descending                            colon. The polyp was semi-pedunculated. The polyp                            was removed with a hot snare. Resection and                            retrieval were complete.                           A few small angiodysplastic lesions were found in                             the rectum, likely due to prior history of                            radiation therapy to the prostate.                           The exam was otherwise without abnormality. Complications:            No immediate complications. Estimated blood loss:                            Minimal. Estimated Blood Loss:     Estimated blood loss was minimal. Impression:               - Tortuous colon.                           -  Two diminutive polyps in the cecum, removed with                            a cold biopsy forceps. Resected and retrieved.                           - One diminutive polyp in the ascending colon,                            removed with a cold biopsy forceps. Resected and                            retrieved.                           - One diminutive polyp in the transverse colon,                            removed with a cold biopsy forceps. Resected and                            retrieved.                           - One 7 to 8 mm polyp in the descending colon,                            removed with a hot snare. Resected and retrieved.                           - A few colonic angiodysplastic lesions in the                            rectum likely due to history of prostate radiation.                           - The examination was otherwise normal. Recommendation:           - Patient has a contact number available for                            emergencies. The signs and symptoms of potential                            delayed complications were discussed with the                            patient. Return to normal activities tomorrow.                            Written discharge instructions were provided to the                            patient.                           -  Resume previous diet.                           - Continue present medications.                           - Await pathology results.                           - No ibuprofen, naproxen, or  other non-steroidal                            anti-inflammatory drugs for 2 weeks after polyp                            removal. Remo Lipps P. Armbruster, MD 06/27/2018 2:15:37 PM This report has been signed electronically.

## 2018-06-27 NOTE — Progress Notes (Signed)
Report to PACU, RN, vss, BBS= Clear.  

## 2018-06-27 NOTE — Patient Instructions (Signed)
POLYP INFORMATION GIVEN.  YOU HAD AN ENDOSCOPIC PROCEDURE TODAY AT [facility]:   Refer to the procedure report that was given to you for any specific questions about what was found during the examination.  If the procedure report does not answer your questions, please call your gastroenterologist to clarify.  If you requested that your care partner not be given the details of your procedure findings, then the procedure report has been included in a sealed envelope for you to review at your convenience later.  YOU SHOULD EXPECT: Some feelings of bloating in the abdomen. Passage of more gas than usual.  Walking can help get rid of the air that was put into your GI tract during the procedure and reduce the bloating. If you had a lower endoscopy (such as a colonoscopy or flexible sigmoidoscopy) you may notice spotting of blood in your stool or on the toilet paper. If you underwent a bowel prep for your procedure, you may not have a normal bowel movement for a few days.  Please Note:  You might notice some irritation and congestion in your nose or some drainage.  This is from the oxygen used during your procedure.  There is no need for concern and it should clear up in a day or so.  SYMPTOMS TO REPORT IMMEDIATELY:   Following lower endoscopy (colonoscopy or flexible sigmoidoscopy):  Excessive amounts of blood in the stool  Significant tenderness or worsening of abdominal pains  Swelling of the abdomen that is new, acute For urgent or emergent issues, a gastroenterologist can be reached at any hour by calling [phone].   DIET:  We do recommend a small meal at first, but then you may proceed to your regular diet.  Drink plenty of fluids but you should avoid alcoholic beverages for 24 hours.  ACTIVITY:  You should plan to take it easy for the rest of today and you should NOT DRIVE or use heavy machinery until tomorrow (because of the sedation medicines used Fever of 100F or  higher  during the test).    FOLLOW UP: Our staff will call the number listed on your records the next business day following your procedure to check on you and address any questions or concerns that you may have regarding the information given to you following your procedure. If we do not reach you, we will leave a message.  However, if you are feeling well and you are not experiencing any problems, there is no need to return our call.  We will assume that you have returned to your regular daily activities without incident.  If any biopsies were taken you will be contacted by phone or by letter within the next 1-3 weeks.  Please call us at [phone] if you have not heard about the biopsies in 3 weeks.    SIGNATURES/CONFIDENTIALITY: You and/or your care partner have signed paperwork which will be entered into your electronic medical record.  These signatures attest to the fact that that the information above on your After Visit Summary has been reviewed and is understood.  Full responsibility of the confidentiality of this discharge information lies with you and/or your care-partner.

## 2018-07-02 ENCOUNTER — Telehealth: Payer: Self-pay | Admitting: *Deleted

## 2018-07-02 NOTE — Telephone Encounter (Signed)
  Follow up Call-  Call back number 06/27/2018  Post procedure Call Back phone  # [phone]  Permission to leave phone message Yes  Some recent data might be hidden     Patient questions:  Do you have a fever, pain , or abdominal swelling? No. Pain Score  0 *  Have you tolerated food without any problems? Yes.    Have you been able to return to your normal activities? Yes.    Do you have any questions about your discharge instructions: Diet   No. Medications  No. Follow up visit  No.  Do you have questions or concerns about your Care? No.  Actions: * If pain score is 4 or above: No action needed, pain <4.

## 2018-07-03 ENCOUNTER — Encounter: Payer: Self-pay | Admitting: Gastroenterology

## 2018-10-01 DIAGNOSIS — R490 Dysphonia: Secondary | ICD-10-CM | POA: Diagnosis not present

## 2018-10-01 DIAGNOSIS — I348 Other nonrheumatic mitral valve disorders: Secondary | ICD-10-CM | POA: Diagnosis not present

## 2018-10-01 DIAGNOSIS — I10 Essential (primary) hypertension: Secondary | ICD-10-CM | POA: Diagnosis not present

## 2018-10-01 DIAGNOSIS — Z0000 Encounter for general adult medical examination without abnormal findings: Secondary | ICD-10-CM | POA: Diagnosis not present

## 2018-10-01 DIAGNOSIS — C61 Malignant neoplasm of prostate: Secondary | ICD-10-CM | POA: Diagnosis not present

## 2018-10-01 DIAGNOSIS — E042 Nontoxic multinodular goiter: Secondary | ICD-10-CM | POA: Diagnosis not present

## 2018-10-01 DIAGNOSIS — E46 Unspecified protein-calorie malnutrition: Secondary | ICD-10-CM | POA: Diagnosis not present

## 2018-10-01 DIAGNOSIS — Z23 Encounter for immunization: Secondary | ICD-10-CM | POA: Diagnosis not present

## 2018-10-01 DIAGNOSIS — N522 Drug-induced erectile dysfunction: Secondary | ICD-10-CM | POA: Diagnosis not present

## 2018-10-11 DIAGNOSIS — Z79899 Other long term (current) drug therapy: Secondary | ICD-10-CM | POA: Diagnosis not present

## 2018-10-11 DIAGNOSIS — G244 Idiopathic orofacial dystonia: Secondary | ICD-10-CM | POA: Diagnosis not present

## 2018-10-11 DIAGNOSIS — Z7982 Long term (current) use of aspirin: Secondary | ICD-10-CM | POA: Diagnosis not present

## 2018-10-19 DIAGNOSIS — L300 Nummular dermatitis: Secondary | ICD-10-CM | POA: Diagnosis not present

## 2018-10-19 DIAGNOSIS — B353 Tinea pedis: Secondary | ICD-10-CM | POA: Diagnosis not present

## 2018-10-23 DIAGNOSIS — C61 Malignant neoplasm of prostate: Secondary | ICD-10-CM | POA: Diagnosis not present

## 2018-10-26 DIAGNOSIS — N5201 Erectile dysfunction due to arterial insufficiency: Secondary | ICD-10-CM | POA: Diagnosis not present

## 2018-10-26 DIAGNOSIS — C61 Malignant neoplasm of prostate: Secondary | ICD-10-CM | POA: Diagnosis not present

## 2019-04-02 DIAGNOSIS — R490 Dysphonia: Secondary | ICD-10-CM | POA: Diagnosis not present

## 2019-04-02 DIAGNOSIS — C61 Malignant neoplasm of prostate: Secondary | ICD-10-CM | POA: Diagnosis not present

## 2019-04-02 DIAGNOSIS — I10 Essential (primary) hypertension: Secondary | ICD-10-CM | POA: Diagnosis not present

## 2019-04-02 DIAGNOSIS — E46 Unspecified protein-calorie malnutrition: Secondary | ICD-10-CM | POA: Diagnosis not present

## 2019-04-11 DIAGNOSIS — Z79899 Other long term (current) drug therapy: Secondary | ICD-10-CM | POA: Diagnosis not present

## 2019-04-11 DIAGNOSIS — G244 Idiopathic orofacial dystonia: Secondary | ICD-10-CM | POA: Diagnosis not present

## 2019-04-30 DIAGNOSIS — C61 Malignant neoplasm of prostate: Secondary | ICD-10-CM | POA: Diagnosis not present

## 2019-05-07 DIAGNOSIS — N5201 Erectile dysfunction due to arterial insufficiency: Secondary | ICD-10-CM | POA: Diagnosis not present

## 2019-05-07 DIAGNOSIS — C61 Malignant neoplasm of prostate: Secondary | ICD-10-CM | POA: Diagnosis not present

## 2019-06-18 DIAGNOSIS — H40003 Preglaucoma, unspecified, bilateral: Secondary | ICD-10-CM | POA: Diagnosis not present

## 2019-06-18 DIAGNOSIS — H25813 Combined forms of age-related cataract, bilateral: Secondary | ICD-10-CM | POA: Diagnosis not present

## 2019-07-02 DIAGNOSIS — R490 Dysphonia: Secondary | ICD-10-CM | POA: Diagnosis not present

## 2019-07-02 DIAGNOSIS — C61 Malignant neoplasm of prostate: Secondary | ICD-10-CM | POA: Diagnosis not present

## 2019-07-02 DIAGNOSIS — I10 Essential (primary) hypertension: Secondary | ICD-10-CM | POA: Diagnosis not present

## 2019-10-05 DIAGNOSIS — R69 Illness, unspecified: Secondary | ICD-10-CM | POA: Diagnosis not present

## 2019-10-16 DIAGNOSIS — G244 Idiopathic orofacial dystonia: Secondary | ICD-10-CM | POA: Diagnosis not present

## 2019-10-16 DIAGNOSIS — Z79899 Other long term (current) drug therapy: Secondary | ICD-10-CM | POA: Diagnosis not present

## 2020-01-22 ENCOUNTER — Telehealth: Payer: Self-pay | Admitting: Internal Medicine

## 2020-01-22 NOTE — Telephone Encounter (Signed)
Would like to know if Dr. Jenny Reichmann would take him back on as a patient?  Please advise.

## 2020-01-22 NOTE — Telephone Encounter (Signed)
Ok sure but remember I do not treat chronic pain with narcotics as this is no longer part of my practice, thanks

## 2020-01-29 NOTE — Telephone Encounter (Signed)
Patient scheduled.

## 2020-02-05 ENCOUNTER — Ambulatory Visit (INDEPENDENT_AMBULATORY_CARE_PROVIDER_SITE_OTHER): Payer: Medicare HMO | Admitting: Internal Medicine

## 2020-02-05 ENCOUNTER — Encounter: Payer: Self-pay | Admitting: Internal Medicine

## 2020-02-05 ENCOUNTER — Other Ambulatory Visit: Payer: Self-pay

## 2020-02-05 VITALS — BP 144/66 | HR 80 | Temp 98.8°F | Ht 69.0 in | Wt 124.4 lb

## 2020-02-05 DIAGNOSIS — E611 Iron deficiency: Secondary | ICD-10-CM | POA: Diagnosis not present

## 2020-02-05 DIAGNOSIS — Z0001 Encounter for general adult medical examination with abnormal findings: Secondary | ICD-10-CM | POA: Insufficient documentation

## 2020-02-05 DIAGNOSIS — E559 Vitamin D deficiency, unspecified: Secondary | ICD-10-CM | POA: Diagnosis not present

## 2020-02-05 DIAGNOSIS — H409 Unspecified glaucoma: Secondary | ICD-10-CM

## 2020-02-05 DIAGNOSIS — E538 Deficiency of other specified B group vitamins: Secondary | ICD-10-CM

## 2020-02-05 DIAGNOSIS — Z0000 Encounter for general adult medical examination without abnormal findings: Secondary | ICD-10-CM | POA: Diagnosis not present

## 2020-02-05 DIAGNOSIS — R739 Hyperglycemia, unspecified: Secondary | ICD-10-CM | POA: Diagnosis not present

## 2020-02-05 HISTORY — DX: Unspecified glaucoma: H40.9

## 2020-02-05 LAB — URINALYSIS, ROUTINE W REFLEX MICROSCOPIC
Bilirubin Urine: NEGATIVE
Hgb urine dipstick: NEGATIVE
Ketones, ur: NEGATIVE
Leukocytes,Ua: NEGATIVE
Nitrite: NEGATIVE
RBC / HPF: NONE SEEN (ref 0–?)
Specific Gravity, Urine: 1.02 (ref 1.000–1.030)
Total Protein, Urine: 30 — AB
Urine Glucose: NEGATIVE
Urobilinogen, UA: 0.2 (ref 0.0–1.0)
pH: 7.5 (ref 5.0–8.0)

## 2020-02-05 LAB — CBC WITH DIFFERENTIAL/PLATELET
Basophils Absolute: 0.1 10*3/uL (ref 0.0–0.1)
Basophils Relative: 1.9 % (ref 0.0–3.0)
Eosinophils Absolute: 0.1 10*3/uL (ref 0.0–0.7)
Eosinophils Relative: 1.9 % (ref 0.0–5.0)
HCT: 40 % (ref 39.0–52.0)
Hemoglobin: 13.1 g/dL (ref 13.0–17.0)
Lymphocytes Relative: 57.3 % — ABNORMAL HIGH (ref 12.0–46.0)
Lymphs Abs: 2.3 10*3/uL (ref 0.7–4.0)
MCHC: 32.7 g/dL (ref 30.0–36.0)
MCV: 96 fl (ref 78.0–100.0)
Monocytes Absolute: 0.3 10*3/uL (ref 0.1–1.0)
Monocytes Relative: 7.5 % (ref 3.0–12.0)
Neutro Abs: 1.3 10*3/uL — ABNORMAL LOW (ref 1.4–7.7)
Neutrophils Relative %: 31.4 % — ABNORMAL LOW (ref 43.0–77.0)
Platelets: 274 10*3/uL (ref 150.0–400.0)
RBC: 4.17 Mil/uL — ABNORMAL LOW (ref 4.22–5.81)
RDW: 13 % (ref 11.5–15.5)
WBC: 4 10*3/uL (ref 4.0–10.5)

## 2020-02-05 LAB — LIPID PANEL
Cholesterol: 204 mg/dL — ABNORMAL HIGH (ref 0–200)
HDL: 69.6 mg/dL (ref >39.00)
LDL Cholesterol: 124 mg/dL — ABNORMAL HIGH (ref 0–99)
NonHDL: 134.27
Total CHOL/HDL Ratio: 3
Triglycerides: 52 mg/dL (ref 0.0–149.0)
VLDL: 10.4 mg/dL (ref 0.0–40.0)

## 2020-02-05 LAB — VITAMIN B12: Vitamin B-12: 312 pg/mL (ref 211–911)

## 2020-02-05 LAB — HEMOGLOBIN A1C: Hgb A1c MFr Bld: 5.4 % (ref 4.6–6.5)

## 2020-02-05 LAB — VITAMIN D 25 HYDROXY (VIT D DEFICIENCY, FRACTURES): VITD: 8.91 ng/mL — ABNORMAL LOW (ref 30.00–100.00)

## 2020-02-05 LAB — HEPATIC FUNCTION PANEL
ALT: 13 U/L (ref 0–53)
AST: 24 U/L (ref 0–37)
Albumin: 4.8 g/dL (ref 3.5–5.2)
Alkaline Phosphatase: 60 U/L (ref 39–117)
Bilirubin, Direct: 0.2 mg/dL (ref 0.0–0.3)
Total Bilirubin: 1.5 mg/dL — ABNORMAL HIGH (ref 0.2–1.2)
Total Protein: 7.2 g/dL (ref 6.0–8.3)

## 2020-02-05 LAB — BASIC METABOLIC PANEL
BUN: 15 mg/dL (ref 6–23)
CO2: 30 mEq/L (ref 19–32)
Calcium: 9.6 mg/dL (ref 8.4–10.5)
Chloride: 104 mEq/L (ref 96–112)
Creatinine, Ser: 0.81 mg/dL (ref 0.40–1.50)
GFR: 111.53 mL/min (ref >60.00)
Glucose, Bld: 99 mg/dL (ref 70–99)
Potassium: 3.9 mEq/L (ref 3.5–5.1)
Sodium: 140 mEq/L (ref 135–145)

## 2020-02-05 LAB — IBC PANEL
Iron: 82 ug/dL (ref 42–165)
Saturation Ratios: 27.6 % (ref 20.0–50.0)
Transferrin: 212 mg/dL (ref 212.0–360.0)

## 2020-02-05 LAB — PSA: PSA: 0.13 ng/mL (ref 0.10–4.00)

## 2020-02-05 LAB — TSH: TSH: 2.55 u[IU]/mL (ref 0.35–4.50)

## 2020-02-05 MED ORDER — MIRTAZAPINE 15 MG PO TABS: 15.0000 mg | ORAL_TABLET | Freq: Every day | ORAL | 3 refills | Status: DC

## 2020-02-05 MED ORDER — AMLODIPINE BESYLATE 10 MG PO TABS: 10.0000 mg | ORAL_TABLET | Freq: Every morning | ORAL | 3 refills | Status: DC

## 2020-02-05 NOTE — Assessment & Plan Note (Signed)

## 2020-02-05 NOTE — Addendum Note (Signed)
Addended by: Isaiah Serge D on: 02/05/2020 11:53 AM   Modules accepted: Orders

## 2020-02-05 NOTE — Patient Instructions (Signed)

## 2020-02-05 NOTE — Assessment & Plan Note (Signed)
Also for a1c with labs

## 2020-02-05 NOTE — Progress Notes (Signed)
Subjective:    Patient ID: GIOVANN SALAHUDDIN, male    DOB: 09/07/1941, 79 y.o.   MRN: [id number]  HPI  Here for wellness and establish;  Overall doing ok;  Pt denies Chest pain, worsening SOB, DOE, wheezing, orthopnea, PND, worsening LE edema, palpitations, dizziness or syncope.  Pt denies neurological change such as new headache, facial or extremity weakness.  Pt denies polydipsia, polyuria, or low sugar symptoms. Pt states overall good compliance with treatment and medications, good tolerability, and has been trying to follow appropriate diet.  Pt denies worsening depressive symptoms, suicidal ideation or panic. No fever, night sweats, wt loss, loss of appetite, or other constitutional symptoms.  Pt states good ability with ADL's, has low fall risk, home safety reviewed and adequate, no other significant changes in hearing or vision, and only occasionally active with exercise.  BP at home < 140/90 BP Readings from Last 3 Encounters:  02/05/20 (!) 144/66  06/27/18 (!) 143/82  05/08/18 130/74   Wt Readings from Last 3 Encounters:  02/05/20 124 lb 6.4 oz (56.4 kg)  06/27/18 121 lb (54.9 kg)  05/08/18 121 lb 6 oz (55.1 kg)   Past Medical History:  Diagnosis Date  . Colon polyps   . Depression   . Dyslipidemia   . History of glaucoma    S/P   LASER [place]  2003  . History of kidney stones   . Hypertension   . Lymphocytosis (symptomatic)   . Orofacial dyskinesia   . 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  . Prostate cancer ([place]) DX  2009   GLEASON  3+4,  T1c,  PSA  4.59   Past Surgical History:  Procedure Laterality Date  . COLONOSCOPY  2007  . CYSTOSCOPY N/A 06/05/2014   Procedure: CYSTOSCOPY;  Surgeon: Raynelle Bring, MD;  Location: [facility];  Service: Urology;  Laterality: N/A;  . PROSTATE BIOPSY  MULTIPLE-  LAST ONE  03-11-2014  . RADIOACTIVE SEED IMPLANT N/A 06/05/2014   Procedure: RADIOACTIVE SEED IMPLANT;  Surgeon: Raynelle Bring, MD;  Location: [facility];   Service: Urology;  Laterality: N/A;  . REFRACTIVE SURGERY Left 2003   GLAUCOMA  . URETEROLITHOTOMY  1980    reports that he quit smoking about 38 years ago. His smoking use included cigarettes. He has a 9.00 pack-year smoking history. He has never used smokeless tobacco. He reports that he does not drink alcohol or use drugs. family history includes Diabetes in his brother and sister; Heart disease in his brother, brother, and sister; Nephrolithiasis in his brother; Prostate cancer in his brother and father. No Known Allergies , Current Outpatient Medications on File Prior to Visit  Medication Sig Dispense Refill  . amLODipine (NORVASC) 10 MG tablet Take 10 mg by mouth every morning.     Marland Kitchen aspirin 81 MG tablet Take 81 mg by mouth daily.    . clonazePAM (KLONOPIN) 0.5 MG tablet Take 0.5 mg by mouth 3 (three) times daily.     . mirtazapine (REMERON) 15 MG tablet Take 15 mg by mouth at bedtime.     Current Facility-Administered Medications on File Prior to Visit  Medication Dose Route Frequency Provider Last Rate Last Admin  . 0.9 %  sodium chloride infusion  500 mL Intravenous Once Armbruster, Carlota Raspberry, MD       Review of Systems All otherwise neg per pt     Objective:   Physical Exam BP (!) 144/66   Pulse 80   Temp 98.8 F (  37.1 C)   Ht 5\' 9"  (1.753 m)   Wt 124 lb 6.4 oz (56.4 kg)   SpO2 99%   BMI 18.37 kg/m  VS noted,  Constitutional: Pt appears in NAD HENT: Head: NCAT.  Right Ear: External ear normal.  Left Ear: External ear normal.  Eyes: . Pupils are equal, round, and reactive to light. Conjunctivae and EOM are normal Nose: without d/c or deformity Neck: Neck supple. Gross normal ROM Cardiovascular: Normal rate and regular rhythm.   Pulmonary/Chest: Effort normal and breath sounds without rales or wheezing.  Abd:  Soft, NT, ND, + BS, no organomegaly Neurological: Pt is alert. At baseline orientation, motor grossly intact Skin: Skin is warm. No rashes, other new  lesions, no LE edema Psychiatric: Pt behavior is normal without agitation  All otherwise neg per pt Lab Results  Component Value Date   WBC 6.6 05/29/2014   HGB 14.3 05/29/2014   HCT 42.3 05/29/2014   PLT 246 05/29/2014   GLUCOSE 99 05/29/2014   CHOL 183 01/18/2008   TRIG 53 01/18/2008   HDL 56.6 01/18/2008   LDLCALC 116 (H) 01/18/2008   ALT 11 05/29/2014   AST 21 05/29/2014   NA 141 05/29/2014   K 4.3 05/29/2014   CL 100 05/29/2014   CREATININE 0.71 05/29/2014   BUN 10 05/29/2014   CO2 32 05/29/2014   TSH 2.73 01/18/2008   PSA 4.19 (H) 01/18/2008   INR 0.98 05/29/2014      Assessment & Plan:

## 2020-02-05 NOTE — Addendum Note (Signed)
Addended by: Biagio Borg on: 02/05/2020 11:50 AM   Modules accepted: Level of Service

## 2020-02-08 ENCOUNTER — Other Ambulatory Visit: Payer: Self-pay | Admitting: Internal Medicine

## 2020-02-08 ENCOUNTER — Encounter: Payer: Self-pay | Admitting: Internal Medicine

## 2020-02-08 MED ORDER — VITAMIN D (ERGOCALCIFEROL) 1.25 MG (50000 UNIT) PO CAPS: 50000.0000 [IU] | ORAL_CAPSULE | ORAL | 0 refills | Status: DC

## 2020-02-11 ENCOUNTER — Telehealth: Payer: Self-pay | Admitting: Internal Medicine

## 2020-02-11 NOTE — Telephone Encounter (Signed)
Patient returned call to clinic and results given.

## 2020-02-11 NOTE — Telephone Encounter (Signed)
° ° °  Please return call to patient to discuss lab results °

## 2020-02-11 NOTE — Telephone Encounter (Signed)
Call patient Patrick Dudley with information mentioned

## 2020-04-15 DIAGNOSIS — G244 Idiopathic orofacial dystonia: Secondary | ICD-10-CM | POA: Diagnosis not present

## 2020-04-15 DIAGNOSIS — Z79899 Other long term (current) drug therapy: Secondary | ICD-10-CM | POA: Diagnosis not present

## 2020-04-25 ENCOUNTER — Other Ambulatory Visit: Payer: Self-pay | Admitting: Internal Medicine

## 2020-04-25 NOTE — Telephone Encounter (Signed)
Please change to OTC Vitamin D3 at 2000 units per day, indefinitely.  

## 2020-05-01 DIAGNOSIS — C61 Malignant neoplasm of prostate: Secondary | ICD-10-CM | POA: Diagnosis not present

## 2020-05-06 DIAGNOSIS — C61 Malignant neoplasm of prostate: Secondary | ICD-10-CM | POA: Diagnosis not present

## 2020-05-06 DIAGNOSIS — N5201 Erectile dysfunction due to arterial insufficiency: Secondary | ICD-10-CM | POA: Diagnosis not present

## 2020-06-22 DIAGNOSIS — H40003 Preglaucoma, unspecified, bilateral: Secondary | ICD-10-CM | POA: Diagnosis not present

## 2020-06-22 DIAGNOSIS — H25813 Combined forms of age-related cataract, bilateral: Secondary | ICD-10-CM | POA: Diagnosis not present

## 2020-08-04 ENCOUNTER — Ambulatory Visit: Payer: Medicare HMO | Admitting: Internal Medicine

## 2020-08-05 ENCOUNTER — Encounter: Payer: Self-pay | Admitting: Internal Medicine

## 2020-08-05 ENCOUNTER — Other Ambulatory Visit: Payer: Self-pay

## 2020-08-05 ENCOUNTER — Ambulatory Visit (INDEPENDENT_AMBULATORY_CARE_PROVIDER_SITE_OTHER): Payer: Medicare HMO | Admitting: Internal Medicine

## 2020-08-05 VITALS — BP 140/70 | HR 66 | Temp 98.1°F | Ht 69.0 in | Wt 125.0 lb

## 2020-08-05 DIAGNOSIS — E559 Vitamin D deficiency, unspecified: Secondary | ICD-10-CM

## 2020-08-05 DIAGNOSIS — Z1159 Encounter for screening for other viral diseases: Secondary | ICD-10-CM

## 2020-08-05 DIAGNOSIS — R739 Hyperglycemia, unspecified: Secondary | ICD-10-CM | POA: Diagnosis not present

## 2020-08-05 DIAGNOSIS — I10 Essential (primary) hypertension: Secondary | ICD-10-CM

## 2020-08-05 DIAGNOSIS — E785 Hyperlipidemia, unspecified: Secondary | ICD-10-CM

## 2020-08-05 NOTE — Assessment & Plan Note (Signed)
stable overall by history and exam, recent data reviewed with pt, and pt to continue medical treatment as before,  to f/u any worsening symptoms or concerns  

## 2020-08-05 NOTE — Assessment & Plan Note (Addendum)

## 2020-08-05 NOTE — Progress Notes (Signed)
Subjective:    Patient ID: Patrick Dudley, male    DOB: [birth year], 79 y.o.   MRN: [id number]  HPI  Here to f/u; overall doing ok,  Pt denies chest pain, increasing sob or doe, wheezing, orthopnea, PND, increased LE swelling, palpitations, dizziness or syncope.  Pt denies new neurological symptoms such as new headache, or facial or extremity weakness or numbness.  Pt denies polydipsia, polyuria, or low sugar episode.  Pt states overall good compliance with meds, mostly trying to follow appropriate diet, with wt overall stable,  but little exercise however.  Tolerating vit d well.  No new complaints Past Medical History:  Diagnosis Date  . Colon polyps   . Depression   . Dyslipidemia   . Glaucoma 02/05/2020  . History of glaucoma    S/P   LASER [place]  2003  . History of kidney stones   . Hypertension   . Lymphocytosis (symptomatic)   . Orofacial dyskinesia   . 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  . Prostate cancer ([place]) DX  2009   GLEASON  3+4,  T1c,  PSA  4.59   Past Surgical History:  Procedure Laterality Date  . COLONOSCOPY  2007  . CYSTOSCOPY N/A 06/05/2014   Procedure: CYSTOSCOPY;  Surgeon: Raynelle Bring, MD;  Location: [facility];  Service: Urology;  Laterality: N/A;  . PROSTATE BIOPSY  MULTIPLE-  LAST ONE  03-11-2014  . RADIOACTIVE SEED IMPLANT N/A 06/05/2014   Procedure: RADIOACTIVE SEED IMPLANT;  Surgeon: Raynelle Bring, MD;  Location: [facility];  Service: Urology;  Laterality: N/A;  . REFRACTIVE SURGERY Left 2003   GLAUCOMA  . URETEROLITHOTOMY  1980    reports that he quit smoking about 38 years ago. His smoking use included cigarettes. He has a 9.00 pack-year smoking history. He has never used smokeless tobacco. He reports that he does not drink alcohol and does not use drugs. family history includes Diabetes in his brother and sister; Heart disease in his brother, brother, and sister; Nephrolithiasis in his brother; Prostate cancer in his brother  and father. No Known Allergies Current Outpatient Medications on File Prior to Visit  Medication Sig Dispense Refill  . amLODipine (NORVASC) 10 MG tablet Take 1 tablet (10 mg total) by mouth every morning. 90 tablet 3  . aspirin 81 MG tablet Take 81 mg by mouth daily.    . clonazePAM (KLONOPIN) 0.5 MG tablet Take 0.5 mg by mouth 3 (three) times daily.     . mirtazapine (REMERON) 15 MG tablet Take 1 tablet (15 mg total) by mouth at bedtime. 90 tablet 3   No current facility-administered medications on file prior to visit.   Review of Systems All otherwise neg per pt    Objective:   Physical Exam BP 140/70 (BP Location: Left Arm, Patient Position: Sitting, Cuff Size: Large)   Pulse 66   Temp 98.1 F (36.7 C) (Oral)   Ht 5\' 9"  (1.753 m)   Wt 125 lb (56.7 kg)   SpO2 93%   BMI 18.46 kg/m  VS noted,  Constitutional: Pt appears in NAD HENT: Head: NCAT.  Right Ear: External ear normal.  Left Ear: External ear normal.  Eyes: . Pupils are equal, round, and reactive to light. Conjunctivae and EOM are normal Nose: without d/c or deformity Neck: Neck supple. Gross normal ROM Cardiovascular: Normal rate and regular rhythm.   Pulmonary/Chest: Effort normal and breath sounds without rales or wheezing.  Abd:  Soft, NT, ND, + BS,  no organomegaly Neurological: Pt is alert. At baseline orientation, motor grossly intact Skin: Skin is warm. No rashes, other new lesions, no LE edema Psychiatric: Pt behavior is normal without agitation  All otherwise neg per pt Lab Results  Component Value Date   WBC 4.0 02/05/2020   HGB 13.1 02/05/2020   HCT 40.0 02/05/2020   PLT 274.0 02/05/2020   GLUCOSE 99 02/05/2020   CHOL 204 (H) 02/05/2020   TRIG 52.0 02/05/2020   HDL 69.60 02/05/2020   LDLCALC 124 (H) 02/05/2020   ALT 13 02/05/2020   AST 24 02/05/2020   NA 140 02/05/2020   K 3.9 02/05/2020   CL 104 02/05/2020   CREATININE 0.81 02/05/2020   BUN 15 02/05/2020   CO2 30 02/05/2020   TSH 2.55  02/05/2020   PSA 0.13 02/05/2020   INR 0.98 05/29/2014   HGBA1C 5.4 02/05/2020      Assessment & Plan:

## 2020-08-05 NOTE — Assessment & Plan Note (Signed)
Cont oral replacement 

## 2020-08-05 NOTE — Patient Instructions (Signed)
Please continue all other medications as before, and refills have been done if requested.  Please have the pharmacy call with any other refills you may need.  Please continue your efforts at being more active, low cholesterol diet, and weight control.  Please keep your appointments with your specialists as you may have planned  Please make an Appointment to return in 6 months, or sooner if needed 

## 2020-08-05 NOTE — Assessment & Plan Note (Signed)
Mild elevated, declines statin, for lower chol diet

## 2020-08-13 DIAGNOSIS — G249 Dystonia, unspecified: Secondary | ICD-10-CM | POA: Diagnosis not present

## 2020-08-13 DIAGNOSIS — I10 Essential (primary) hypertension: Secondary | ICD-10-CM | POA: Diagnosis not present

## 2020-08-13 DIAGNOSIS — E559 Vitamin D deficiency, unspecified: Secondary | ICD-10-CM | POA: Diagnosis not present

## 2020-08-13 DIAGNOSIS — R636 Underweight: Secondary | ICD-10-CM | POA: Diagnosis not present

## 2020-08-13 DIAGNOSIS — I951 Orthostatic hypotension: Secondary | ICD-10-CM | POA: Diagnosis not present

## 2020-08-13 DIAGNOSIS — N529 Male erectile dysfunction, unspecified: Secondary | ICD-10-CM | POA: Diagnosis not present

## 2020-08-13 DIAGNOSIS — K08409 Partial loss of teeth, unspecified cause, unspecified class: Secondary | ICD-10-CM | POA: Diagnosis not present

## 2020-08-13 DIAGNOSIS — Z681 Body mass index (BMI) 19 or less, adult: Secondary | ICD-10-CM | POA: Diagnosis not present

## 2020-08-13 DIAGNOSIS — Z7982 Long term (current) use of aspirin: Secondary | ICD-10-CM | POA: Diagnosis not present

## 2020-08-13 DIAGNOSIS — Z7722 Contact with and (suspected) exposure to environmental tobacco smoke (acute) (chronic): Secondary | ICD-10-CM | POA: Diagnosis not present

## 2020-09-02 ENCOUNTER — Other Ambulatory Visit: Payer: Self-pay | Admitting: Internal Medicine

## 2020-10-20 DIAGNOSIS — G244 Idiopathic orofacial dystonia: Secondary | ICD-10-CM | POA: Diagnosis not present

## 2020-10-20 DIAGNOSIS — Z79899 Other long term (current) drug therapy: Secondary | ICD-10-CM | POA: Diagnosis not present

## 2020-10-23 DIAGNOSIS — R69 Illness, unspecified: Secondary | ICD-10-CM | POA: Diagnosis not present

## 2021-02-05 ENCOUNTER — Ambulatory Visit: Payer: Medicare HMO | Admitting: Internal Medicine

## 2021-02-09 ENCOUNTER — Other Ambulatory Visit: Payer: Self-pay

## 2021-02-09 ENCOUNTER — Ambulatory Visit (INDEPENDENT_AMBULATORY_CARE_PROVIDER_SITE_OTHER): Payer: Medicare HMO | Admitting: Internal Medicine

## 2021-02-09 ENCOUNTER — Encounter: Payer: Self-pay | Admitting: Internal Medicine

## 2021-02-09 VITALS — BP 120/76 | HR 65 | Temp 97.7°F | Ht 69.0 in | Wt 124.0 lb

## 2021-02-09 DIAGNOSIS — Z0001 Encounter for general adult medical examination with abnormal findings: Secondary | ICD-10-CM | POA: Diagnosis not present

## 2021-02-09 DIAGNOSIS — C61 Malignant neoplasm of prostate: Secondary | ICD-10-CM | POA: Diagnosis not present

## 2021-02-09 DIAGNOSIS — R739 Hyperglycemia, unspecified: Secondary | ICD-10-CM | POA: Diagnosis not present

## 2021-02-09 DIAGNOSIS — E7849 Other hyperlipidemia: Secondary | ICD-10-CM

## 2021-02-09 DIAGNOSIS — Z1159 Encounter for screening for other viral diseases: Secondary | ICD-10-CM

## 2021-02-09 DIAGNOSIS — E559 Vitamin D deficiency, unspecified: Secondary | ICD-10-CM | POA: Diagnosis not present

## 2021-02-09 DIAGNOSIS — I10 Essential (primary) hypertension: Secondary | ICD-10-CM

## 2021-02-09 LAB — URINALYSIS, ROUTINE W REFLEX MICROSCOPIC
Bilirubin Urine: NEGATIVE
Hgb urine dipstick: NEGATIVE
Ketones, ur: NEGATIVE
Leukocytes,Ua: NEGATIVE
Nitrite: NEGATIVE
Specific Gravity, Urine: 1.02 (ref 1.000–1.030)
Total Protein, Urine: NEGATIVE
Urine Glucose: NEGATIVE
Urobilinogen, UA: 0.2 (ref 0.0–1.0)
WBC, UA: NONE SEEN (ref 0–?)
pH: 7.5 (ref 5.0–8.0)

## 2021-02-09 LAB — BASIC METABOLIC PANEL
BUN: 17 mg/dL (ref 6–23)
CO2: 33 mEq/L — ABNORMAL HIGH (ref 19–32)
Calcium: 10.3 mg/dL (ref 8.4–10.5)
Chloride: 102 mEq/L (ref 96–112)
Creatinine, Ser: 0.8 mg/dL (ref 0.40–1.50)
GFR: 84.5 mL/min (ref >60.00)
Glucose, Bld: 103 mg/dL — ABNORMAL HIGH (ref 70–99)
Potassium: 4.4 mEq/L (ref 3.5–5.1)
Sodium: 142 mEq/L (ref 135–145)

## 2021-02-09 LAB — LIPID PANEL
Cholesterol: 200 mg/dL (ref 0–200)
HDL: 73.3 mg/dL (ref >39.00)
LDL Cholesterol: 115 mg/dL — ABNORMAL HIGH (ref 0–99)
NonHDL: 126.86
Total CHOL/HDL Ratio: 3
Triglycerides: 59 mg/dL (ref 0.0–149.0)
VLDL: 11.8 mg/dL (ref 0.0–40.0)

## 2021-02-09 LAB — CBC WITH DIFFERENTIAL/PLATELET
Basophils Absolute: 0.1 10*3/uL (ref 0.0–0.1)
Basophils Relative: 1.4 % (ref 0.0–3.0)
Eosinophils Absolute: 0.1 10*3/uL (ref 0.0–0.7)
Eosinophils Relative: 1.8 % (ref 0.0–5.0)
HCT: 42.2 % (ref 39.0–52.0)
Hemoglobin: 14 g/dL (ref 13.0–17.0)
Lymphocytes Relative: 54.8 % — ABNORMAL HIGH (ref 12.0–46.0)
Lymphs Abs: 2.2 10*3/uL (ref 0.7–4.0)
MCHC: 33.2 g/dL (ref 30.0–36.0)
MCV: 95.7 fl (ref 78.0–100.0)
Monocytes Absolute: 0.3 10*3/uL (ref 0.1–1.0)
Monocytes Relative: 8.1 % (ref 3.0–12.0)
Neutro Abs: 1.4 10*3/uL (ref 1.4–7.7)
Neutrophils Relative %: 33.9 % — ABNORMAL LOW (ref 43.0–77.0)
Platelets: 275 10*3/uL (ref 150.0–400.0)
RBC: 4.41 Mil/uL (ref 4.22–5.81)
RDW: 12.9 % (ref 11.5–15.5)
WBC: 4 10*3/uL (ref 4.0–10.5)

## 2021-02-09 LAB — HEPATIC FUNCTION PANEL
ALT: 13 U/L (ref 0–53)
AST: 22 U/L (ref 0–37)
Albumin: 5.1 g/dL (ref 3.5–5.2)
Alkaline Phosphatase: 56 U/L (ref 39–117)
Bilirubin, Direct: 0.2 mg/dL (ref 0.0–0.3)
Total Bilirubin: 1.2 mg/dL (ref 0.2–1.2)
Total Protein: 7.8 g/dL (ref 6.0–8.3)

## 2021-02-09 LAB — VITAMIN D 25 HYDROXY (VIT D DEFICIENCY, FRACTURES): VITD: 50.91 ng/mL (ref 30.00–100.00)

## 2021-02-09 LAB — TSH: TSH: 2.45 u[IU]/mL (ref 0.35–4.50)

## 2021-02-09 LAB — PSA: PSA: 0.11 ng/mL (ref 0.10–4.00)

## 2021-02-09 LAB — HEMOGLOBIN A1C: Hgb A1c MFr Bld: 5.2 % (ref 4.6–6.5)

## 2021-02-09 MED ORDER — AMLODIPINE BESYLATE 10 MG PO TABS: 10.0000 mg | ORAL_TABLET | Freq: Every morning | ORAL | 3 refills | Status: DC

## 2021-02-09 NOTE — Progress Notes (Signed)
Patient ID: Patrick Dudley, male   DOB: September 03, 1941, 80 y.o.   MRN: [id number]         Chief Complaint:: wellness exam and  Hyperglycemia, prostate ca, low vit D, and HLD       HPI:  Patrick Dudley is a 80 y.o. male here for wellness exam; declnies Tdap, but need hep c screeen.  Was married for 68 yrs, wife died 37 yrs ago with breast ca, never remarried after, b/c would lose wife pension $1000/mo.  Denies urinary symptoms such as dysuria, frequency, urgency, flank pain, hematuria or n/v, fever, chills.  Asks for f/u PSA today with hx of prostate ca.   Pt denies polydipsia, polyuria, Taking vit d 2000 u qd  Does not want to take statin, trying to follow lower chol diet.  Pt denies chest pain, increased sob or doe, wheezing, orthopnea, PND, increased LE swelling, palpitations, dizziness or syncope.  Denies focal neuro s/s  Wt Readings from Last 3 Encounters:  02/09/21 124 lb (56.2 kg)  08/05/20 125 lb (56.7 kg)  02/05/20 124 lb 6.4 oz (56.4 kg)   BP Readings from Last 3 Encounters:  02/09/21 120/76  08/05/20 140/70  02/05/20 (!) 144/66   Immunization History  Administered Date(s) Administered  . Influenza-Unspecified 09/28/2020  . Moderna Sars-Covid-2 Vaccination 03/26/2020, 04/12/2020, 12/09/2020  . Pneumococcal Conjugate-13 12/26/2017, 01/26/2018   There are no preventive care reminders to display for this patient.   Past Medical History:  Diagnosis Date  . Colon polyps   . Depression   . Dyslipidemia   . Glaucoma 02/05/2020  . History of glaucoma    S/P   LASER [place]  2003  . History of kidney stones   . Hypertension   . Lymphocytosis (symptomatic)   . Orofacial dyskinesia   . 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  . Prostate cancer ([place]) DX  2009   GLEASON  3+4,  T1c,  PSA  4.59   Past Surgical History:  Procedure Laterality Date  . COLONOSCOPY  2007  . CYSTOSCOPY N/A 06/05/2014   Procedure: CYSTOSCOPY;  Surgeon: Raynelle Bring, MD;  Location[facility];   Service: Urology;  Laterality: N/A;  . PROSTATE BIOPSY  MULTIPLE-  LAST ONE  03-11-2014  . RADIOACTIVE SEED IMPLANT N/A 06/05/2014   Procedure: RADIOACTIVE SEED IMPLANT;  Surgeon: Raynelle Bring, MD;  Location: [facility];  Service: Urology;  Laterality: N/A;  . REFRACTIVE SURGERY Left 2003   GLAUCOMA  . URETEROLITHOTOMY  1980    reports that he quit smoking about 39 years ago. His smoking use included cigarettes. He has a 9.00 pack-year smoking history. He has never used smokeless tobacco. He reports that he does not drink alcohol and does not use drugs. family history includes Diabetes in his brother and sister; Heart disease in his brother, brother, and sister; Nephrolithiasis in his brother; Prostate cancer in his brother and father. No Known Allergies Current Outpatient Medications on File Prior to Visit  Medication Sig Dispense Refill  . aspirin 81 MG tablet Take 81 mg by mouth daily.    . Cholecalciferol 50 MCG (2000 UT) CAPS Take by mouth.    . clonazePAM (KLONOPIN) 0.5 MG tablet Take 0.5 mg by mouth 3 (three) times daily.     . mirtazapine (REMERON) 15 MG tablet TAKE 1 TABLET BY MOUTH EVERYDAY AT BEDTIME 90 tablet 3  . triamcinolone ointment (KENALOG) 0.1 % Apply topically 2 (two) times daily.     No current facility-administered  medications on file prior to visit.        ROS:  All others reviewed and negative.  Objective        PE:  BP 120/76   Pulse 65   Temp 97.7 F (36.5 C) (Oral)   Ht 5\' 9"  (1.753 m)   Wt 124 lb (56.2 kg)   SpO2 96%   BMI 18.31 kg/m                 Constitutional: Pt appears in NAD               HENT: Head: NCAT.                Right Ear: External ear normal.                 Left Ear: External ear normal.                Eyes: . Pupils are equal, round, and reactive to light. Conjunctivae and EOM are normal               Nose: without d/c or deformity               Neck: Neck supple. Gross normal ROM               Cardiovascular:  Normal rate and regular rhythm.                 Pulmonary/Chest: Effort normal and breath sounds without rales or wheezing.                Abd:  Soft, NT, ND, + BS, no organomegaly               Neurological: Pt is alert. At baseline orientation, motor grossly intact               Skin: Skin is warm. No rashes, no other new lesions, LE edema - none               Psychiatric: Pt behavior is normal without agitation   Micro: none  Cardiac tracings I have personally interpreted today:  none  Pertinent Radiological findings (summarize): none   Lab Results  Component Value Date   WBC 4.0 02/09/2021   HGB 14.0 02/09/2021   HCT 42.2 02/09/2021   PLT 275.0 02/09/2021   GLUCOSE 103 (H) 02/09/2021   CHOL 200 02/09/2021   TRIG 59.0 02/09/2021   HDL 73.30 02/09/2021   LDLCALC 115 (H) 02/09/2021   ALT 13 02/09/2021   AST 22 02/09/2021   NA 142 02/09/2021   K 4.4 02/09/2021   CL 102 02/09/2021   CREATININE 0.80 02/09/2021   BUN 17 02/09/2021   CO2 33 (H) 02/09/2021   TSH 2.45 02/09/2021   PSA 0.11 02/09/2021   INR 0.98 05/29/2014   HGBA1C 5.2 02/09/2021   Assessment/Plan:  Patrick Dudley is a 80 y.o. Black or African American [2] male with  has a past medical history of Colon polyps, Depression, Dyslipidemia, Glaucoma (02/05/2020), History of glaucoma, History of kidney stones, Hypertension, Lymphocytosis (symptomatic), Orofacial dyskinesia, Oromandibular dystonia (04/13/2016), and Prostate cancer ([place]) (DX  2009).  Prostatic adenocarcinoma ([place]) For psa, and f/u Dr Alinda Money urology as planned  HLD (hyperlipidemia) Goal ldl < 100, uncontrolled, declines statin for now, ,to continue to work on lower chol diet  Encounter for well adult exam with abnormal findings Age and sex appropriate education and counseling updated with  regular exercise and diet Referrals for preventative services - for hep c screen Immunizations addressed - declines Tdap Smoking counseling  - none  needed Evidence for depression or other mood disorder - none significant Most recent labs reviewed. I have personally reviewed and have noted: 1) the patient's medical and social history 2) The patient's current medications and supplements 3) The patient's height, weight, and BMI have been recorded in the chart   Essential hypertension BP Readings from Last 3 Encounters:  02/09/21 120/76  08/05/20 140/70  02/05/20 (!) 144/66   Stable, pt to continue medical treatment amlodipine   Current Outpatient Medications (Cardiovascular):  .  amLODipine (NORVASC) 10 MG tablet, Take 1 tablet (10 mg total) by mouth every morning.   Current Outpatient Medications (Analgesics):  .  aspirin 81 MG tablet, Take 81 mg by mouth daily.   Current Outpatient Medications (Other):  Marland Kitchen  Cholecalciferol 50 MCG (2000 UT) CAPS, Take by mouth. .  clonazePAM (KLONOPIN) 0.5 MG tablet, Take 0.5 mg by mouth 3 (three) times daily.  .  mirtazapine (REMERON) 15 MG tablet, TAKE 1 TABLET BY MOUTH EVERYDAY AT BEDTIME .  triamcinolone ointment (KENALOG) 0.1 %, Apply topically 2 (two) times daily.   Hyperglycemia Lab Results  Component Value Date   HGBA1C 5.2 02/09/2021   Stable, pt to continue current medical treatment  - diet   Vitamin D deficiency . Last vitamin D Lab Results  Component Value Date   VD25OH 50.91 02/09/2021   Stable, cont oral replacement  Followup: Return in about 1 year (around 02/09/2022).  Cathlean Cower, MD 02/15/2021 9:59 PM [facility]

## 2021-02-09 NOTE — Assessment & Plan Note (Signed)
For psa, and f/u Dr Alinda Money urology as planned

## 2021-02-09 NOTE — Patient Instructions (Signed)

## 2021-02-09 NOTE — Assessment & Plan Note (Signed)
Goal ldl < 100, uncontrolled, declines statin for now, ,to continue to work on lower chol diet

## 2021-02-10 LAB — HEPATITIS C ANTIBODY
Hepatitis C Ab: NONREACTIVE
SIGNAL TO CUT-OFF: 0.05 (ref <1.00)

## 2021-02-15 ENCOUNTER — Encounter: Payer: Self-pay | Admitting: Internal Medicine

## 2021-02-15 NOTE — Assessment & Plan Note (Signed)
BP Readings from Last 3 Encounters:  02/09/21 120/76  08/05/20 140/70  02/05/20 (!) 144/66   Stable, pt to continue medical treatment amlodipine   Current Outpatient Medications (Cardiovascular):  .  amLODipine (NORVASC) 10 MG tablet, Take 1 tablet (10 mg total) by mouth every morning.   Current Outpatient Medications (Analgesics):  .  aspirin 81 MG tablet, Take 81 mg by mouth daily.   Current Outpatient Medications (Other):  Marland Kitchen  Cholecalciferol 50 MCG (2000 UT) CAPS, Take by mouth. .  clonazePAM (KLONOPIN) 0.5 MG tablet, Take 0.5 mg by mouth 3 (three) times daily.  .  mirtazapine (REMERON) 15 MG tablet, TAKE 1 TABLET BY MOUTH EVERYDAY AT BEDTIME .  triamcinolone ointment (KENALOG) 0.1 %, Apply topically 2 (two) times daily.

## 2021-02-15 NOTE — Assessment & Plan Note (Signed)
Lab Results  Component Value Date   HGBA1C 5.2 02/09/2021   Stable, pt to continue current medical treatment  - diet

## 2021-02-15 NOTE — Assessment & Plan Note (Signed)
.   Last vitamin D Lab Results  Component Value Date   VD25OH 50.91 02/09/2021   Stable, cont oral replacement

## 2021-02-15 NOTE — Assessment & Plan Note (Signed)
Age and sex appropriate education and counseling updated with regular exercise and diet Referrals for preventative services - for hep c screen Immunizations addressed - declines Tdap Smoking counseling  - none needed Evidence for depression or other mood disorder - none significant Most recent labs reviewed. I have personally reviewed and have noted: 1) the patient's medical and social history 2) The patient's current medications and supplements 3) The patient's height, weight, and BMI have been recorded in the chart

## 2021-04-20 DIAGNOSIS — G244 Idiopathic orofacial dystonia: Secondary | ICD-10-CM | POA: Diagnosis not present

## 2021-04-20 DIAGNOSIS — Z79899 Other long term (current) drug therapy: Secondary | ICD-10-CM | POA: Diagnosis not present

## 2021-05-07 DIAGNOSIS — C61 Malignant neoplasm of prostate: Secondary | ICD-10-CM | POA: Diagnosis not present

## 2021-05-14 DIAGNOSIS — N5201 Erectile dysfunction due to arterial insufficiency: Secondary | ICD-10-CM | POA: Diagnosis not present

## 2021-05-14 DIAGNOSIS — C61 Malignant neoplasm of prostate: Secondary | ICD-10-CM | POA: Diagnosis not present

## 2021-07-30 DIAGNOSIS — H40013 Open angle with borderline findings, low risk, bilateral: Secondary | ICD-10-CM | POA: Diagnosis not present

## 2021-07-30 DIAGNOSIS — H25813 Combined forms of age-related cataract, bilateral: Secondary | ICD-10-CM | POA: Diagnosis not present

## 2021-08-04 DIAGNOSIS — Z8249 Family history of ischemic heart disease and other diseases of the circulatory system: Secondary | ICD-10-CM | POA: Diagnosis not present

## 2021-08-04 DIAGNOSIS — Z833 Family history of diabetes mellitus: Secondary | ICD-10-CM | POA: Diagnosis not present

## 2021-08-04 DIAGNOSIS — N529 Male erectile dysfunction, unspecified: Secondary | ICD-10-CM | POA: Diagnosis not present

## 2021-08-04 DIAGNOSIS — I10 Essential (primary) hypertension: Secondary | ICD-10-CM | POA: Diagnosis not present

## 2021-08-04 DIAGNOSIS — J383 Other diseases of vocal cords: Secondary | ICD-10-CM | POA: Diagnosis not present

## 2021-08-04 DIAGNOSIS — Z8546 Personal history of malignant neoplasm of prostate: Secondary | ICD-10-CM | POA: Diagnosis not present

## 2021-08-04 DIAGNOSIS — Z008 Encounter for other general examination: Secondary | ICD-10-CM | POA: Diagnosis not present

## 2021-08-04 DIAGNOSIS — Z87891 Personal history of nicotine dependence: Secondary | ICD-10-CM | POA: Diagnosis not present

## 2021-08-04 DIAGNOSIS — Z809 Family history of malignant neoplasm, unspecified: Secondary | ICD-10-CM | POA: Diagnosis not present

## 2021-08-04 DIAGNOSIS — R636 Underweight: Secondary | ICD-10-CM | POA: Diagnosis not present

## 2021-08-06 ENCOUNTER — Encounter: Payer: Self-pay | Admitting: Gastroenterology

## 2021-08-23 ENCOUNTER — Encounter: Payer: Self-pay | Admitting: Gastroenterology

## 2021-08-29 ENCOUNTER — Other Ambulatory Visit: Payer: Self-pay | Admitting: Internal Medicine

## 2021-10-14 ENCOUNTER — Encounter: Payer: Self-pay | Admitting: Gastroenterology

## 2021-10-14 ENCOUNTER — Ambulatory Visit: Payer: Medicare HMO | Admitting: Gastroenterology

## 2021-10-14 VITALS — BP 130/80 | HR 72 | Ht 69.5 in | Wt 122.1 lb

## 2021-10-14 DIAGNOSIS — Z86010 Personal history of colonic polyps: Secondary | ICD-10-CM

## 2021-10-14 NOTE — Progress Notes (Signed)
HPI :  80 year old male with a history of prostate cancer, colon polyps, hypertension, here to reestablish care and discuss if he wishes to have a surveillance colonoscopy.  The patient's last colonoscopy was in July 2019.  As below, he had 5 adenomas removed however the majority of them, 4, were diminutive.  The largest was 7 to 8 mm in size.  He has been feeling really well in general he states without any complaints.  He states his prostate cancer is in remission.  He denies any significant cardiopulmonary comorbidities.  He denies any blood in his stools.  Regular bowel movements.  He has no family history of colon cancer.  In general he feels well without complaints today.   Colonoscopy 06/27/2018 - The perianal and digital rectal examinations were normal. - The colon was tortuous with significant looping. - Two sessile polyps were found in the cecum. The polyps were diminutive in size. These polyps were removed with a cold biopsy forceps. Resection and retrieval were complete. - A diminutive polyp was found in the ascending colon. The polyp was sessile. The polyp was removed with a cold biopsy forceps. Resection and retrieval were complete. - A diminutive polyp was found in the transverse colon. The polyp was sessile. The polyp was removed with a cold biopsy forceps. Resection and retrieval were complete. - A 7 to 8 mm polyp was found in the descending colon. The polyp was semi-pedunculated. The polyp was removed with a hot snare. Resection and retrieval were complete. - A few small angiodysplastic lesions were found in the rectum, likely due to prior history of radiation therapy to the prostate. - The exam was otherwise without abnormality.  Diagnosis 1. Surgical [P], ascending and cecum, transverse, polyp (4) - TUBULAR ADENOMA(S). - HIGH GRADE DYSPLASIA IS NOT IDENTIFIED. 2. Surgical [P], descending, polyp - TUBULAR ADENOMA(S). - HIGH GRADE DYSPLASIA IS NOT IDENTIFIED.    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   Prostate cancer ([place]) DX  2009   GLEASON  3+4,  T1c,  PSA  4.59     Past Surgical History:  Procedure Laterality Date   COLONOSCOPY  2007   CYSTOSCOPY N/A 06/05/2014   Procedure: CYSTOSCOPY;  Surgeon: Raynelle Bring, MD;  Location: [facility]  Service: Urology;  Laterality: N/A;   PROSTATE BIOPSY  MULTIPLE-  LAST ONE  03-11-2014   RADIOACTIVE SEED IMPLANT N/A 06/05/2014   Procedure: RADIOACTIVE SEED IMPLANT;  Surgeon: Raynelle Bring, MD;  Location[facility];  Service: Urology;  Laterality: N/A;   REFRACTIVE SURGERY Left 2003   GLAUCOMA   URETEROLITHOTOMY  1980   Family History  Problem Relation Age of Onset   Prostate cancer Father    Nephrolithiasis Brother    Prostate cancer Brother    Diabetes Sister    Heart disease Sister    Diabetes Brother    Heart disease Brother    Heart disease Brother    Social History   Tobacco Use   Smoking status: Former    Packs/day: 0.50    Years: 18.00    Pack years: 9.00    Types: Cigarettes  Quit date: 12/26/1981    Years since quitting: 39.8   Smokeless tobacco: Never  Vaping Use   Vaping Use: Never used  Substance Use Topics   Alcohol use: No   Drug use: No   Current Outpatient Medications  Medication Sig Dispense Refill   amLODipine (NORVASC) 10 MG tablet Take 1 tablet (10 mg total) by mouth every morning. 90 tablet 3   aspirin 81 MG tablet Take 81 mg by mouth daily.     Cholecalciferol 50 MCG (2000 UT) CAPS Take by mouth.     clonazePAM (KLONOPIN) 0.5 MG tablet Take 0.5 mg by mouth 3 (three) times daily.      mirtazapine (REMERON) 15 MG tablet TAKE 1 TABLET BY MOUTH EVERYDAY AT BEDTIME 90 tablet 1   No  current facility-administered medications for this visit.   No Known Allergies   Review of Systems: All systems reviewed and negative except where noted in HPI.   Lab Results  Component Value Date   WBC 4.0 02/09/2021   HGB 14.0 02/09/2021   HCT 42.2 02/09/2021   MCV 95.7 02/09/2021   PLT 275.0 02/09/2021      Physical Exam: BP 130/80 (BP Location: Left Arm, Patient Position: Sitting, Cuff Size: Normal)   Pulse 72   Ht 5' 9.5" (1.765 m) Comment: height measured without shoes  Wt 122 lb 2 oz (55.4 kg)   BMI 17.78 kg/m  Constitutional: Pleasant,well-developed, male in no acute distress. Cardiovascular: Normal rate, regular rhythm.  Pulmonary/chest: Effort normal and breath sounds normal.  Abdominal: Soft, nondistended, nontender. There are no masses palpable. . Extremities: no edema Lymphadenopathy: No cervical adenopathy noted. Neurological: Alert and oriented to person place and time. Skin: Skin is warm and dry. No rashes noted. Psychiatric: Normal mood and affect. Behavior is normal.   ASSESSMENT AND PLAN: 80 year old male here for reassessment of the following:  History of colon polyps in a patient with age greater than 36  We discussed the patient's history of colon polyps 3 years ago.  While he had 5 adenomas removed, the majority of these were diminutive, largest polyp was also considered to be small.  He is healthy for his age without any significant comorbidities however given his age it is unclear if further surveillance will benefit him, especially with diminutive polyps previously. I ran his demographics through eprognosis tool for cancer screening, which suggested harm outweighed benefits in his case. We discussed risks of colonoscopy versus risks of not doing a colonoscopy.  I think his risk for colon cancer in his lifetime after his last exam, is likely quite low.  I do not feel strongly that he warrants another colonoscopy given his age and only diminutive  polyps on his last exam but ultimately up to the patient and how aggressive he wishes to do surveillance, and his comfort level of not doing additional surveillance.  After full discussion of both sides of this, he is comfortable foregoing further screening and would like to avoid future colonoscopy if possible, and I think that is reasonable.  He will be mindful of his bowel habits to keep an eye on things, if he notices any blood in his stools, regular bowel habits, or abdominal pain he will contact me for reassessment.  Otherwise he can follow-up as needed  Jolly Mango, MD [facility]  CC: Biagio Borg, MD

## 2021-10-26 DIAGNOSIS — G244 Idiopathic orofacial dystonia: Secondary | ICD-10-CM | POA: Diagnosis not present

## 2022-02-10 ENCOUNTER — Other Ambulatory Visit: Payer: Self-pay

## 2022-02-10 ENCOUNTER — Encounter: Payer: Self-pay | Admitting: Internal Medicine

## 2022-02-10 ENCOUNTER — Ambulatory Visit (INDEPENDENT_AMBULATORY_CARE_PROVIDER_SITE_OTHER): Payer: Medicare HMO | Admitting: Internal Medicine

## 2022-02-10 VITALS — BP 122/76 | HR 54 | Temp 98.0°F | Ht 69.5 in | Wt 115.4 lb

## 2022-02-10 DIAGNOSIS — E538 Deficiency of other specified B group vitamins: Secondary | ICD-10-CM

## 2022-02-10 DIAGNOSIS — E559 Vitamin D deficiency, unspecified: Secondary | ICD-10-CM | POA: Diagnosis not present

## 2022-02-10 DIAGNOSIS — C61 Malignant neoplasm of prostate: Secondary | ICD-10-CM

## 2022-02-10 DIAGNOSIS — Z23 Encounter for immunization: Secondary | ICD-10-CM

## 2022-02-10 DIAGNOSIS — Z0001 Encounter for general adult medical examination with abnormal findings: Secondary | ICD-10-CM | POA: Diagnosis not present

## 2022-02-10 DIAGNOSIS — R739 Hyperglycemia, unspecified: Secondary | ICD-10-CM | POA: Diagnosis not present

## 2022-02-10 DIAGNOSIS — I10 Essential (primary) hypertension: Secondary | ICD-10-CM

## 2022-02-10 DIAGNOSIS — E7849 Other hyperlipidemia: Secondary | ICD-10-CM

## 2022-02-10 LAB — URINALYSIS, ROUTINE W REFLEX MICROSCOPIC
Bilirubin Urine: NEGATIVE
Hgb urine dipstick: NEGATIVE
Ketones, ur: NEGATIVE
Leukocytes,Ua: NEGATIVE
Nitrite: NEGATIVE
Specific Gravity, Urine: >=1.03 — AB (ref 1.000–1.030)
Total Protein, Urine: 100 — AB
Urine Glucose: NEGATIVE
Urobilinogen, UA: 0.2 (ref 0.0–1.0)
pH: 6 (ref 5.0–8.0)

## 2022-02-10 LAB — LIPID PANEL
Cholesterol: 168 mg/dL (ref 0–200)
HDL: 64.9 mg/dL (ref >39.00)
LDL Cholesterol: 91 mg/dL (ref 0–99)
NonHDL: 102.86
Total CHOL/HDL Ratio: 3
Triglycerides: 59 mg/dL (ref 0.0–149.0)
VLDL: 11.8 mg/dL (ref 0.0–40.0)

## 2022-02-10 LAB — BASIC METABOLIC PANEL
BUN: 18 mg/dL (ref 6–23)
CO2: 37 mEq/L — ABNORMAL HIGH (ref 19–32)
Calcium: 10 mg/dL (ref 8.4–10.5)
Chloride: 101 mEq/L (ref 96–112)
Creatinine, Ser: 0.92 mg/dL (ref 0.40–1.50)
GFR: 78.87 mL/min (ref >60.00)
Glucose, Bld: 95 mg/dL (ref 70–99)
Potassium: 4.3 mEq/L (ref 3.5–5.1)
Sodium: 140 mEq/L (ref 135–145)

## 2022-02-10 LAB — HEPATIC FUNCTION PANEL
ALT: 12 U/L (ref 0–53)
AST: 24 U/L (ref 0–37)
Albumin: 4.8 g/dL (ref 3.5–5.2)
Alkaline Phosphatase: 51 U/L (ref 39–117)
Bilirubin, Direct: 0.2 mg/dL (ref 0.0–0.3)
Total Bilirubin: 1.6 mg/dL — ABNORMAL HIGH (ref 0.2–1.2)
Total Protein: 7 g/dL (ref 6.0–8.3)

## 2022-02-10 LAB — VITAMIN D 25 HYDROXY (VIT D DEFICIENCY, FRACTURES): VITD: 58.58 ng/mL (ref 30.00–100.00)

## 2022-02-10 LAB — CBC WITH DIFFERENTIAL/PLATELET
Basophils Absolute: 0 10*3/uL (ref 0.0–0.1)
Basophils Relative: 0.9 % (ref 0.0–3.0)
Eosinophils Absolute: 0.1 10*3/uL (ref 0.0–0.7)
Eosinophils Relative: 1.6 % (ref 0.0–5.0)
HCT: 37.1 % — ABNORMAL LOW (ref 39.0–52.0)
Hemoglobin: 12.3 g/dL — ABNORMAL LOW (ref 13.0–17.0)
Lymphocytes Relative: 57.2 % — ABNORMAL HIGH (ref 12.0–46.0)
Lymphs Abs: 2.7 10*3/uL (ref 0.7–4.0)
MCHC: 33.1 g/dL (ref 30.0–36.0)
MCV: 96.1 fl (ref 78.0–100.0)
Monocytes Absolute: 0.4 10*3/uL (ref 0.1–1.0)
Monocytes Relative: 8.3 % (ref 3.0–12.0)
Neutro Abs: 1.5 10*3/uL (ref 1.4–7.7)
Neutrophils Relative %: 32 % — ABNORMAL LOW (ref 43.0–77.0)
Platelets: 291 10*3/uL (ref 150.0–400.0)
RBC: 3.86 Mil/uL — ABNORMAL LOW (ref 4.22–5.81)
RDW: 12.9 % (ref 11.5–15.5)
WBC: 4.7 10*3/uL (ref 4.0–10.5)

## 2022-02-10 LAB — TSH: TSH: 2.64 u[IU]/mL (ref 0.35–5.50)

## 2022-02-10 LAB — HEMOGLOBIN A1C: Hgb A1c MFr Bld: 5.4 % (ref 4.6–6.5)

## 2022-02-10 LAB — PSA: PSA: 0.07 ng/mL — ABNORMAL LOW (ref 0.10–4.00)

## 2022-02-10 LAB — VITAMIN B12: Vitamin B-12: 334 pg/mL (ref 211–911)

## 2022-02-10 MED ORDER — AMLODIPINE BESYLATE 10 MG PO TABS: 10.0000 mg | ORAL_TABLET | Freq: Every morning | ORAL | 3 refills | Status: DC

## 2022-02-10 MED ORDER — MIRTAZAPINE 15 MG PO TABS: ORAL_TABLET | ORAL | 3 refills | Status: DC

## 2022-02-10 NOTE — Patient Instructions (Addendum)
You had the Pneumovax pneumonia shot today  Please continue all other medications as before, and refills have been done if requested.  Please have the pharmacy call with any other refills you may need.  Please continue your efforts at being more active, low cholesterol diet, and weight control.  You are otherwise up to date with prevention measures today.  Please keep your appointments with your specialists as you may have planned  Please go to the LAB at the blood drawing area for the tests to be done  You will be contacted by phone if any changes need to be made immediately.  Otherwise, you will receive a letter about your results with an explanation, but please check with MyChart first.  Please remember to sign up for MyChart if you have not done so, as this will be important to you in the future with finding out test results, communicating by private email, and scheduling acute appointments online when needed.  Please make an Appointment to return in 6 months, or sooner if needed

## 2022-02-10 NOTE — Progress Notes (Signed)
Patient ID: Patrick Dudley, male   DOB: [birth year], 81 y.o.   MRN: [id number]         Chief Complaint:: wellness exam and hyperglycemia, prostate ca, hld       HPI:  Patrick Dudley is a 81 y.o. male here for wellness exam; due for pneumovax, declines shingrix, tdap, o/w up to date                       Also, Pt denies chest pain, increased sob or doe, wheezing, orthopnea, PND, increased LE swelling, palpitations, dizziness or syncope.   Pt denies polydipsia, polyuria, or new focal neuro s/s.   Pt denies fever, night sweats, loss of appetite, or other constitutional symptoms  except  Lost wt with some dental work recently. Trying to follow lower chol diet.  Denies urinary symptoms such as dysuria, frequency, urgency, flank pain, hematuria or n/v, fever, chills. Wt Readings from Last 3 Encounters:  02/10/22 115 lb 6.4 oz (52.3 kg)  10/14/21 122 lb 2 oz (55.4 kg)  02/09/21 124 lb (56.2 kg)   BP Readings from Last 3 Encounters:  02/10/22 122/76  10/14/21 130/80  02/09/21 120/76   Immunization History  Administered Date(s) Administered   Fluad Quad(high Dose 65+) 10/05/2019   Influenza, High Dose Seasonal PF 09/01/2018, 10/18/2021   Influenza-Unspecified 09/28/2020   Moderna Sars-Covid-2 Vaccination 03/26/2020, 04/12/2020, 12/09/2020   Pneumococcal Conjugate-13 12/26/2017, 01/26/2018   Pneumococcal Polysaccharide-23 02/10/2022   There are no preventive care reminders to display for this patient.     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;  Service: Urology;  Laterality: N/A;   REFRACTIVE SURGERY Left 2003   GLAUCOMA   URETEROLITHOTOMY  1980    reports that he quit smoking about 40 years ago. His smoking use included cigarettes. He has a 9.00 pack-year smoking history. He has never used smokeless tobacco. He reports that he does not drink alcohol and does not use drugs. family history includes Diabetes in his brother and sister; Heart disease in his brother, brother, and sister; Nephrolithiasis in his brother; Prostate cancer in his brother and father. No Known Allergies Current Outpatient Medications on File Prior to Visit  Medication Sig Dispense Refill   aspirin 81 MG tablet Take 81 mg by mouth daily.     Cholecalciferol 50 MCG (2000 UT) CAPS Take by mouth.     clonazePAM (KLONOPIN) 0.5 MG tablet Take 0.5 mg by mouth 3 (three) times daily.      No current facility-administered medications on file prior to visit.  ROS:  All others reviewed and negative.  Objective        PE:  BP 122/76 (BP Location: Left Arm, Patient Position: Sitting, Cuff Size: Normal)    Pulse (!) 54    Temp 98 F (36.7 C) (Oral)    Ht 5' 9.5" (1.765 m)    Wt 115 lb 6.4 oz (52.3 kg)    SpO2 98%    BMI 16.80 kg/m                 Constitutional: Pt appears in NAD               HENT: Head: NCAT.                Right Ear: External ear normal.                 Left Ear: External ear normal.                Eyes: . Pupils are equal, round, and reactive to light. Conjunctivae and EOM are normal               Nose: without d/c or deformity               Neck: Neck supple. Gross normal ROM               Cardiovascular: Normal rate and regular rhythm.                  Pulmonary/Chest: Effort normal and breath sounds without rales or wheezing.                Abd:  Soft, NT, ND, + BS, no organomegaly               Neurological: Pt is alert. At baseline orientation, motor grossly intact               Skin: Skin is warm. No rashes, no other new lesions, LE edema - none               Psychiatric: Pt behavior is normal without agitation   Micro: none  Cardiac tracings I have personally interpreted today:  none  Pertinent Radiological findings (summarize): none   Lab Results  Component Value Date   WBC 4.7 02/10/2022   HGB 12.3 (L) 02/10/2022   HCT 37.1 (L) 02/10/2022   PLT 291.0 02/10/2022   GLUCOSE 95 02/10/2022   CHOL 168 02/10/2022   TRIG 59.0 02/10/2022   HDL 64.90 02/10/2022   LDLCALC 91 02/10/2022   ALT 12 02/10/2022   AST 24 02/10/2022   NA 140 02/10/2022   K 4.3 02/10/2022   CL 101 02/10/2022   CREATININE 0.92 02/10/2022   BUN 18 02/10/2022   CO2 37 (H) 02/10/2022   TSH 2.64 02/10/2022   PSA 0.07 (L) 02/10/2022   INR 0.98 05/29/2014   HGBA1C 5.4 02/10/2022   Assessment/Plan:  Patrick Dudley is a 81 y.o. Black or African American [2] male with  has a past medical history of Colon polyps, Depression, Dyslipidemia, Glaucoma (02/05/2020), History of glaucoma, History of kidney stones, Hypertension, Lymphocytosis (symptomatic), Orofacial dyskinesia, Oromandibular dystonia (04/13/2016), and Prostate cancer ([place]) (DX  2009).  Vitamin D deficiency Last vitamin D Lab Results  Component Value Date   VD25OH 50.91 02/09/2021   Stable, cont oral replacement d  Encounter for well adult exam with abnormal findings Age and sex appropriate education and counseling updated with regular  exercise and diet Referrals for preventative services - none needed Immunizations addressed - for pneumovax, declines shingrix and tdap Smoking counseling  - none needed Evidence for depression or other mood disorder - none significant Most recent  labs reviewed. I have personally reviewed and have noted: 1) the patient's medical and social history 2) The patient's current medications and supplements 3) The patient's height, weight, and BMI have been recorded in the chart   Prostatic adenocarcinoma [facility]) Also for f/u psa and urology as planned  Essential hypertension BP Readings from Last 3 Encounters:  02/10/22 122/76  10/14/21 130/80  02/09/21 120/76   Stable, pt to continue medical treatment norvasc   HLD (hyperlipidemia) Lab Results  Component Value Date   LDLCALC 91 02/10/2022   Uncontrolled, goal ldl < 70, pt to continue current low chol diet, declines statin   Hyperglycemia Lab Results  Component Value Date   HGBA1C 5.4 02/10/2022   Stable, pt to continue current medical treatment  - diet  Followup: Return in about 6 months (around 08/10/2022).  Cathlean Cower, MD 02/13/2022 9:54 PM [facility]

## 2022-02-10 NOTE — Assessment & Plan Note (Signed)
Last vitamin D Lab Results  Component Value Date   VD25OH 50.91 02/09/2021   Stable, cont oral replacement d

## 2022-02-13 ENCOUNTER — Encounter: Payer: Self-pay | Admitting: Internal Medicine

## 2022-02-13 NOTE — Assessment & Plan Note (Signed)
Lab Results  Component Value Date   LDLCALC 91 02/10/2022   Uncontrolled, goal ldl < 70, pt to continue current low chol diet, declines statin

## 2022-02-13 NOTE — Assessment & Plan Note (Signed)
Age and sex appropriate education and counseling updated with regular exercise and diet Referrals for preventative services - none needed Immunizations addressed - for pneumovax, declines shingrix and tdap Smoking counseling  - none needed Evidence for depression or other mood disorder - none significant Most recent labs reviewed. I have personally reviewed and have noted: 1) the patient's medical and social history 2) The patient's current medications and supplements 3) The patient's height, weight, and BMI have been recorded in the chart

## 2022-02-13 NOTE — Assessment & Plan Note (Signed)
Also for f/u psa and urology as planned

## 2022-02-13 NOTE — Assessment & Plan Note (Signed)
BP Readings from Last 3 Encounters:  02/10/22 122/76  10/14/21 130/80  02/09/21 120/76   Stable, pt to continue medical treatment norvasc

## 2022-02-13 NOTE — Assessment & Plan Note (Signed)
Lab Results  Component Value Date   HGBA1C 5.4 02/10/2022   Stable, pt to continue current medical treatment  - diet

## 2022-04-26 DIAGNOSIS — G244 Idiopathic orofacial dystonia: Secondary | ICD-10-CM | POA: Diagnosis not present

## 2022-05-13 DIAGNOSIS — C61 Malignant neoplasm of prostate: Secondary | ICD-10-CM | POA: Diagnosis not present

## 2022-05-18 DIAGNOSIS — N5201 Erectile dysfunction due to arterial insufficiency: Secondary | ICD-10-CM | POA: Diagnosis not present

## 2022-05-18 DIAGNOSIS — C61 Malignant neoplasm of prostate: Secondary | ICD-10-CM | POA: Diagnosis not present

## 2022-08-02 DIAGNOSIS — H40013 Open angle with borderline findings, low risk, bilateral: Secondary | ICD-10-CM | POA: Diagnosis not present

## 2022-08-02 DIAGNOSIS — H25813 Combined forms of age-related cataract, bilateral: Secondary | ICD-10-CM | POA: Diagnosis not present

## 2022-08-11 ENCOUNTER — Encounter: Payer: Self-pay | Admitting: Internal Medicine

## 2022-08-11 ENCOUNTER — Ambulatory Visit (INDEPENDENT_AMBULATORY_CARE_PROVIDER_SITE_OTHER): Payer: Medicare HMO | Admitting: Internal Medicine

## 2022-08-11 VITALS — BP 136/78 | HR 55 | Temp 97.8°F | Ht 69.5 in | Wt 125.0 lb

## 2022-08-11 DIAGNOSIS — E7800 Pure hypercholesterolemia, unspecified: Secondary | ICD-10-CM

## 2022-08-11 DIAGNOSIS — E559 Vitamin D deficiency, unspecified: Secondary | ICD-10-CM | POA: Diagnosis not present

## 2022-08-11 DIAGNOSIS — L989 Disorder of the skin and subcutaneous tissue, unspecified: Secondary | ICD-10-CM | POA: Insufficient documentation

## 2022-08-11 DIAGNOSIS — R739 Hyperglycemia, unspecified: Secondary | ICD-10-CM | POA: Diagnosis not present

## 2022-08-11 DIAGNOSIS — I10 Essential (primary) hypertension: Secondary | ICD-10-CM | POA: Diagnosis not present

## 2022-08-11 NOTE — Assessment & Plan Note (Signed)
Lab Results  Component Value Date   HGBA1C 5.4 02/10/2022   Stable, pt to continue current medical treatment  - diet, wt control, excercise

## 2022-08-11 NOTE — Assessment & Plan Note (Signed)
BP Readings from Last 3 Encounters:  08/11/22 136/78  02/10/22 122/76  10/14/21 130/80   Stable, pt to continue medical treatment norvasc 10 mg qd

## 2022-08-11 NOTE — Progress Notes (Signed)
Patient ID: Patrick Dudley, male   DOB: 23-Mar-1941, 81 y.o.   MRN: [id number]        Chief Complaint: follow up skin lesion left mid lateral leg, hld, htn, hyperglyemia, low vit d       HPI:  Patrick Dudley is a 81 y.o. male overall doing well, did have insect bite to left mid lateal leg with about 3 cm red tender surrounding, but used neosporin topical and now resolved except for mild tender dark central skin lesion after the healing, mild itchy as well.  Pt denies chest pain, increased sob or doe, wheezing, orthopnea, PND, increased LE swelling, palpitations, dizziness or syncope.   Pt denies polydipsia, polyuria, or new focal neuro s/s. Has also seen urology DR Alinda Money recently with f/u PSA neg after radiation seed tx for prostate ca.      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   Prostate cancer ([place]) DX  2009   GLEASON  3+4,  T1c,  PSA  4.59   Past Surgical History:  Procedure Laterality Date   COLONOSCOPY  2007   CYSTOSCOPY N/A 06/05/2014   Procedure: CYSTOSCOPY;  Surgeon: Raynelle Bring, MD;  Location: [facility];  Service: Urology;  Laterality: N/A;   PROSTATE BIOPSY  MULTIPLE-  LAST ONE  03-11-2014   RADIOACTIVE SEED IMPLANT N/A 06/05/2014   Procedure: RADIOACTIVE SEED IMPLANT;  Surgeon: Raynelle Bring, MD;  Location: [facility];  Service: Urology;  Laterality: N/A;   REFRACTIVE SURGERY Left 2003   GLAUCOMA   URETEROLITHOTOMY  1980    reports that he quit smoking about 40  years ago. His smoking use included cigarettes. He has a 9.00 pack-year smoking history. He has never used smokeless tobacco. He reports that he does not drink alcohol and does not use drugs. family history includes Diabetes in his brother and sister; Heart disease in his brother, brother, and sister; Nephrolithiasis in his brother; Prostate cancer in his brother and father. No Known Allergies Current Outpatient Medications on File Prior to Visit  Medication Sig Dispense Refill   amLODipine (NORVASC) 10 MG tablet Take 1 tablet (10 mg total) by mouth every morning. 90 tablet 3   aspirin 81 MG tablet Take 81 mg by mouth daily.     Cholecalciferol 50 MCG (2000 UT) CAPS Take by mouth.     clonazePAM (KLONOPIN) 0.5 MG tablet Take 0.5 mg by mouth 3 (three) times daily.      mirtazapine (REMERON) 15 MG tablet TAKE 1 TABLET BY MOUTH EVERYDAY AT BEDTIME 90 tablet 3   sildenafil (VIAGRA) 100 MG tablet SMARTSIG:1 Tablet(s) By Mouth As Directed PRN     No current facility-administered medications on file prior to visit.        ROS:  All others reviewed and negative.  Objective  PE:  BP 136/78 (BP Location: Right Arm, Patient Position: Sitting, Cuff Size: Normal)   Pulse (!) 55   Temp 97.8 F (36.6 C) (Oral)   Ht 5' 9.5" (1.765 m)   Wt 125 lb (56.7 kg)   SpO2 92%   BMI 18.19 kg/m                 Constitutional: Pt appears in NAD               HENT: Head: NCAT.                Right Ear: External ear normal.                 Left Ear: External ear normal.                Eyes: . Pupils are equal, round, and reactive to light. Conjunctivae and EOM are normal               Nose: without d/c or deformity               Neck: Neck supple. Gross normal ROM               Cardiovascular: Normal rate and regular rhythm.                 Pulmonary/Chest: Effort normal and breath sounds without rales or wheezing.                Abd:  Soft, NT, ND, + BS, no organomegaly               Neurological: Pt  is alert. At baseline orientation, motor grossly intact               Skin: Skin is warm. LE edema - none, left mid lateal leg with 10 mm area slightly tender dark slight raised lesion without redness, fluctuance, red streaks or drainage ulcer               Psychiatric: Pt behavior is normal without agitation   Micro: none  Cardiac tracings I have personally interpreted today:  none  Pertinent Radiological findings (summarize): none   Lab Results  Component Value Date   WBC 4.7 02/10/2022   HGB 12.3 (L) 02/10/2022   HCT 37.1 (L) 02/10/2022   PLT 291.0 02/10/2022   GLUCOSE 95 02/10/2022   CHOL 168 02/10/2022   TRIG 59.0 02/10/2022   HDL 64.90 02/10/2022   LDLCALC 91 02/10/2022   ALT 12 02/10/2022   AST 24 02/10/2022   NA 140 02/10/2022   K 4.3 02/10/2022   CL 101 02/10/2022   CREATININE 0.92 02/10/2022   BUN 18 02/10/2022   CO2 37 (H) 02/10/2022   TSH 2.64 02/10/2022   PSA 0.07 (L) 02/10/2022   INR 0.98 05/29/2014   HGBA1C 5.4 02/10/2022   Assessment/Plan:  Patrick Dudley is a 81 y.o. Black or African American [2] male with  has a past medical history of Colon polyps, Depression, Dyslipidemia, Glaucoma (02/05/2020), History of glaucoma, History of kidney stones, Hypertension, Lymphocytosis (symptomatic), Orofacial dyskinesia, Oromandibular dystonia (04/13/2016), and Prostate cancer ([place]) (DX  2009).  Vitamin D deficiency Last vitamin D Lab Results  Component Value Date   VD25OH 58.58 02/10/2022   Stable, cont oral replacement   Skin lesion of left leg Has some post inflammatory hyperpgimentaiton With persistent itching after infected insect bite 2 wks ago, pt reassured  - ok for  topical otc hydrocort cream  Prn,  to f/u any worsening symptoms or concerns  HLD (hyperlipidemia) Lab Results  Component Value Date   LDLCALC 91 02/10/2022   Stable, pt to continue low chol diet, declines statin   Essential hypertension BP Readings from Last 3 Encounters:  08/11/22  136/78  02/10/22 122/76  10/14/21 130/80   Stable, pt to continue medical treatment norvasc 10 mg qd   Hyperglycemia Lab Results  Component Value Date   HGBA1C 5.4 02/10/2022   Stable, pt to continue current medical treatment  - diet, wt control, excercise  Followup: Return in about 6 months (around 02/11/2023).  Cathlean Cower, MD 08/11/2022 12:11 PM [facility]

## 2022-08-11 NOTE — Assessment & Plan Note (Signed)
Has some post inflammatory hyperpgimentaiton With persistent itching after infected insect bite 2 wks ago, pt reassured  - ok for topical otc hydrocort cream  Prn,  to f/u any worsening symptoms or concerns

## 2022-08-11 NOTE — Assessment & Plan Note (Signed)
Last vitamin D Lab Results  Component Value Date   VD25OH 58.58 02/10/2022   Stable, cont oral replacement

## 2022-08-11 NOTE — Patient Instructions (Addendum)
Please have your Shingrix (shingles) shots done at your local pharmacy.  Please continue all other medications as before, and refills have been done if requested.  Please have the pharmacy call with any other refills you may need.  Please continue your efforts at being more active, low cholesterol diet, and weight control.  Please keep your appointments with your specialists as you may have planned  Please make an Appointment to return in 6 months, or sooner if needed

## 2022-08-11 NOTE — Assessment & Plan Note (Signed)
Lab Results  Component Value Date   LDLCALC 91 02/10/2022   Stable, pt to continue low chol diet, declines statin

## 2022-09-16 ENCOUNTER — Telehealth: Payer: Self-pay | Admitting: Internal Medicine

## 2022-09-16 NOTE — Telephone Encounter (Signed)
Left message for patient to call back to schedule Medicare Annual Wellness Visit     Please schedule at anytime with [facility] Health Advisor if patient calls the office back.     Any questions, please call me at [phone] 

## 2022-09-22 NOTE — Telephone Encounter (Signed)
Pt scheduled for AWV.

## 2022-09-30 ENCOUNTER — Ambulatory Visit (INDEPENDENT_AMBULATORY_CARE_PROVIDER_SITE_OTHER): Payer: Medicare HMO

## 2022-09-30 VITALS — Ht 69.5 in | Wt 125.0 lb

## 2022-09-30 DIAGNOSIS — Z0000 Encounter for general adult medical examination without abnormal findings: Secondary | ICD-10-CM

## 2022-09-30 NOTE — Patient Instructions (Signed)
Mr. Patrick Dudley , Thank you for taking time to come for your Medicare Wellness Visit. I appreciate your ongoing commitment to your health goals. Please review the following plan we discussed and let me know if I can assist you in the future.   These are the goals we discussed:  Goals      To maintain my current health status by continuing to eat healthy, stay physically active and socially active.        This is a list of the screening recommended for you and due dates:  Health Maintenance  Topic Date Due   Tetanus Vaccine  02/10/2023*   Zoster (Shingles) Vaccine (2 of 2) 11/16/2022   Pneumonia Vaccine  Completed   Flu Shot  Completed   HPV Vaccine  Aged Out   Colon Cancer Screening  Discontinued   COVID-19 Vaccine  Discontinued  *Topic was postponed. The date shown is not the original due date.    Advanced directives: Yes  Conditions/risks identified: Yes  Next appointment: Follow up in one year for your annual wellness visit.   Preventive Care 44 Years and Older, Male  Preventive care refers to lifestyle choices and visits with your health care provider that can promote health and wellness. What does preventive care include? A yearly physical exam. This is also called an annual well check. Dental exams once or twice a year. Routine eye exams. Ask your health care provider how often you should have your eyes checked. Personal lifestyle choices, including: Daily care of your teeth and gums. Regular physical activity. Eating a healthy diet. Avoiding tobacco and drug use. Limiting alcohol use. Practicing safe sex. Taking low doses of aspirin every day. Taking vitamin and mineral supplements as recommended by your health care provider. What happens during an annual well check? The services and screenings done by your health care provider during your annual well check will depend on your age, overall health, lifestyle risk factors, and family history of disease. Counseling   Your health care provider may ask you questions about your: Alcohol use. Tobacco use. Drug use. Emotional well-being. Home and relationship well-being. Sexual activity. Eating habits. History of falls. Memory and ability to understand (cognition). Work and work Statistician. Screening  You may have the following tests or measurements: Height, weight, and BMI. Blood pressure. Lipid and cholesterol levels. These may be checked every 5 years, or more frequently if you are over 80 years old. Skin check. Lung cancer screening. You may have this screening every year starting at age 80 if you have a 30-pack-year history of smoking and currently smoke or have quit within the past 15 years. Fecal occult blood test (FOBT) of the stool. You may have this test every year starting at age 80. Flexible sigmoidoscopy or colonoscopy. You may have a sigmoidoscopy every 5 years or a colonoscopy every 10 years starting at age 50. Prostate cancer screening. Recommendations will vary depending on your family history and other risks. Hepatitis C blood test. Hepatitis B blood test. Sexually transmitted disease (STD) testing. Diabetes screening. This is done by checking your blood sugar (glucose) after you have not eaten for a while (fasting). You may have this done every 1-3 years. Abdominal aortic aneurysm (AAA) screening. You may need this if you are a current or former smoker. Osteoporosis. You may be screened starting at age 80 if you are at high risk. Talk with your health care provider about your test results, treatment options, and if necessary, the need for more  tests. Vaccines  Your health care provider may recommend certain vaccines, such as: Influenza vaccine. This is recommended every year. Tetanus, diphtheria, and acellular pertussis (Tdap, Td) vaccine. You may need a Td booster every 10 years. Zoster vaccine. You may need this after age 80. Pneumococcal 13-valent conjugate (PCV13) vaccine.  One dose is recommended after age 80. Pneumococcal polysaccharide (PPSV23) vaccine. One dose is recommended after age 80. Talk to your health care provider about which screenings and vaccines you need and how often you need them. This information is not intended to replace advice given to you by your health care provider. Make sure you discuss any questions you have with your health care provider. Document Released: 01/08/2016 Document Revised: 08/31/2016 Document Reviewed: 10/13/2015 Elsevier Interactive Patient Education  2017 [employer] Prevention in the Home Falls can cause injuries. They can happen to people of all ages. There are many things you can do to make your home safe and to help prevent falls. What can I do on the outside of my home? Regularly fix the edges of walkways and driveways and fix any cracks. Remove anything that might make you trip as you walk through a door, such as a raised step or threshold. Trim any bushes or trees on the path to your home. Use bright outdoor lighting. Clear any walking paths of anything that might make someone trip, such as rocks or tools. Regularly check to see if handrails are loose or broken. Make sure that both sides of any steps have handrails. Any raised decks and porches should have guardrails on the edges. Have any leaves, snow, or ice cleared regularly. Use sand or salt on walking paths during winter. Clean up any spills in your garage right away. This includes oil or grease spills. What can I do in the bathroom? Use night lights. Install grab bars by the toilet and in the tub and shower. Do not use towel bars as grab bars. Use non-skid mats or decals in the tub or shower. If you need to sit down in the shower, use a plastic, non-slip stool. Keep the floor dry. Clean up any water that spills on the floor as soon as it happens. Remove soap buildup in the tub or shower regularly. Attach bath mats securely with double-sided  non-slip rug tape. Do not have throw rugs and other things on the floor that can make you trip. What can I do in the bedroom? Use night lights. Make sure that you have a light by your bed that is easy to reach. Do not use any sheets or blankets that are too big for your bed. They should not hang down onto the floor. Have a firm chair that has side arms. You can use this for support while you get dressed. Do not have throw rugs and other things on the floor that can make you trip. What can I do in the kitchen? Clean up any spills right away. Avoid walking on wet floors. Keep items that you use a lot in easy-to-reach places. If you need to reach something above you, use a strong step stool that has a grab bar. Keep electrical cords out of the way. Do not use floor polish or wax that makes floors slippery. If you must use wax, use non-skid floor wax. Do not have throw rugs and other things on the floor that can make you trip. What can I do with my stairs? Do not leave any items on the stairs. Make sure  that there are handrails on both sides of the stairs and use them. Fix handrails that are broken or loose. Make sure that handrails are as long as the stairways. Check any carpeting to make sure that it is firmly attached to the stairs. Fix any carpet that is loose or worn. Avoid having throw rugs at the top or bottom of the stairs. If you do have throw rugs, attach them to the floor with carpet tape. Make sure that you have a light switch at the top of the stairs and the bottom of the stairs. If you do not have them, ask someone to add them for you. What else can I do to help prevent falls? Wear shoes that: Do not have high heels. Have rubber bottoms. Are comfortable and fit you well. Are closed at the toe. Do not wear sandals. If you use a stepladder: Make sure that it is fully opened. Do not climb a closed stepladder. Make sure that both sides of the stepladder are locked into place. Ask  someone to hold it for you, if possible. Clearly mark and make sure that you can see: Any grab bars or handrails. First and last steps. Where the edge of each step is. Use tools that help you move around (mobility aids) if they are needed. These include: Canes. Walkers. Scooters. Crutches. Turn on the lights when you go into a dark area. Replace any light bulbs as soon as they burn out. Set up your furniture so you have a clear path. Avoid moving your furniture around. If any of your floors are uneven, fix them. If there are any pets around you, be aware of where they are. Review your medicines with your doctor. Some medicines can make you feel dizzy. This can increase your chance of falling. Ask your doctor what other things that you can do to help prevent falls. This information is not intended to replace advice given to you by your health care provider. Make sure you discuss any questions you have with your health care provider. Document Released: 10/08/2009 Document Revised: 05/19/2016 Document Reviewed: 01/16/2015 Elsevier Interactive Patient Education  2017 [employer].

## 2022-09-30 NOTE — Progress Notes (Signed)
Virtual Visit via Telephone Note  I connected with  Dionisio Paschal on 09/30/22 at  1:00 PM EDT by telephone and verified that I am speaking with the correct person using two identifiers.  Location: Patient: Home Provider: [place] Persons participating in the virtual visit: [place]   I discussed the limitations, risks, security and privacy concerns of performing an evaluation and management service by telephone and the availability of in person appointments. The patient expressed understanding and agreed to proceed.  Interactive audio and video telecommunications were attempted between this nurse and patient, however failed, due to patient having technical difficulties OR patient did not have access to video capability.  We continued and completed visit with audio only.  Some vital signs may be absent or patient reported.   Sheral Flow, LPN  Subjective:   FAVIAN KITTLESON is a 81 y.o. male who presents for an Initial Medicare Annual Wellness Visit.  Review of Systems     Cardiac Risk Factors include: advanced age (>80mn, >>47women);hypertension;family history of premature cardiovascular disease;male gender     Objective:    Today's Vitals   09/30/22 1313  Weight: 125 lb (56.7 kg)  Height: 5' 9.5" (1.765 m)  PainSc: 0-No pain   Body mass index is 18.19 kg/m.     09/30/2022    1:04 PM 06/27/2018    1:22 PM 06/05/2014    1:35 PM 04/04/2014   10:10 AM  Advanced Directives  Does Patient Have a Medical Advance Directive? Yes No Patient does not have advance directive;Patient would not like information Patient has advance directive, copy not in chart  Type of Advance [place]ive [place]     Does patient want to make changes to medical advance directive?    No change requested  Copy of [place] Chart? No - copy requested   Copy requested from family  Would patient like information on  creating a medical advance directive?  No - Patient declined      Current Medications (verified) Outpatient Encounter Medications as of 09/30/2022  Medication Sig   amLODipine (NORVASC) 10 MG tablet Take 1 tablet (10 mg total) by mouth every morning.   aspirin 81 MG tablet Take 81 mg by mouth daily.   Cholecalciferol 50 MCG (2000 UT) CAPS Take by mouth.   clonazePAM (KLONOPIN) 0.5 MG tablet Take 0.5 mg by mouth 3 (three) times daily.    mirtazapine (REMERON) 15 MG tablet TAKE 1 TABLET BY MOUTH EVERYDAY AT BEDTIME   sildenafil (VIAGRA) 100 MG tablet SMARTSIG:1 Tablet(s) By Mouth As Directed PRN   No facility-administered encounter medications on file as of 09/30/2022.    Allergies (verified) Patient has no known allergies.   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   Prostate cancer ([place] DX  2009   GLEASON  3+4,  T1c,  PSA  4.59   Past Surgical History:  Procedure Laterality Date   COLONOSCOPY  2007   CYSTOSCOPY N/A 06/05/2014   Procedure: CYSTOSCOPY;  Surgeon: LRaynelle Bring MD;  [facility]  Service: Urology;  Laterality: N/A;   PROSTATE  BIOPSY  MULTIPLE-  LAST ONE  03-11-2014   RADIOACTIVE SEED IMPLANT N/A 06/05/2014   Procedure: RADIOACTIVE SEED IMPLANT;  Surgeon: Raynelle Bring, MD;  Location: [facility];  Service: Urology;  Laterality: N/A;   REFRACTIVE SURGERY Left 2003   GLAUCOMA   URETEROLITHOTOMY  1980   Family History  Problem Relation Age of Onset   Prostate cancer Father    Nephrolithiasis Brother    Prostate cancer Brother    Diabetes Sister    Heart disease Sister    Diabetes Brother    Heart disease Brother    Heart disease Brother    Social History    Socioeconomic History   Marital status: Widowed    Spouse name: Not on file   Number of children: 1   Years of education: Not on file   Highest education level: Not on file  Occupational History   Occupation: retired  Tobacco Use   Smoking status: Former    Packs/day: 0.50    Years: 18.00    Total pack years: 9.00    Types: Cigarettes    Quit date: 12/26/1981    Years since quitting: 40.7   Smokeless tobacco: Never  Vaping Use   Vaping Use: Never used  Substance and Sexual Activity   Alcohol use: No   Drug use: No   Sexual activity: Never  Other Topics Concern   Not on file  Social History Narrative   Not on file   Social Determinants of Health   Financial Resource Strain: Low Risk  (09/30/2022)   Overall Financial Resource Strain (CARDIA)    Difficulty of Paying Living Expenses: Not hard at all  Food Insecurity: No Food Insecurity (09/30/2022)   Hunger Vital Sign    Worried About Running Out of Food in the Last Year: Never true    [place] in the Last Year: Never true  Transportation Needs: No Transportation Needs (09/30/2022)   PRAPARE - Hydrologist (Medical): No    Lack of Transportation (Non-Medical): No  Physical Activity: Sufficiently Active (09/30/2022)   Exercise Vital Sign    Days of Exercise per Week: 5 days    Minutes of Exercise per Session: 30 min  Stress: No Stress Concern Present (09/30/2022)   [place]    Feeling of Stress : Not at all  Social Connections: Moderately Integrated (09/30/2022)   Social Connection and Isolation Panel [NHANES]    Frequency of Communication with Friends and Family: More than three times a week    Frequency of Social Gatherings with Friends and Family: More than three times a week    Attends Religious Services: More than 4 times per year    Active Member of [employer] or Organizations: Yes    Attends Archivist  Meetings: More than 4 times per year    Marital Status: Widowed    Tobacco Counseling Counseling given: Not Answered   Clinical Intake:  Pre-visit preparation completed: Yes  Pain : No/denies pain Pain Score: 0-No pain     BMI - recorded: 18.19 Nutritional Status: BMI <19  Underweight Nutritional Risks: None Diabetes: No  How often do you need to have someone help you when you read instructions, pamphlets, or other written materials from your doctor or pharmacy?: 1 - Never What is the last grade level you completed in school?: HSG; 2 years at [employer]  Diabetic? No  Interpreter Needed?: No  Information entered by :: Lisette Abu, LPN.   Activities of Daily Living    09/30/2022    1:18 PM  In your present state of health, do you have any difficulty performing the following activities:  Hearing? 0  Vision? 0  Difficulty concentrating or making decisions? 0  Walking or climbing stairs? 0  Dressing or bathing? 0  Doing errands, shopping? 0  Preparing Food and eating ? N  Using the Toilet? N  In the past six months, have you accidently leaked urine? N  Do you have problems with loss of bowel control? N  Managing your Medications? N  Managing your Finances? N  Housekeeping or managing your Housekeeping? N    Patient Care Team: Biagio Borg, MD as PCP - General (Internal Medicine) Alanda Slim Neena Rhymes, MD as Consulting Physician (Ophthalmology)  Indicate any recent Medical Services you may have received from other than [facility] providers in the past year (date may be approximate).     Assessment:   This is a routine wellness examination for [facility].  Hearing/Vision screen Hearing Screening - Comments:: Denies hearing difficulties.  Vision Screening - Comments:: Eye exam done by: Julian Reil, MD.  Dietary issues and exercise activities discussed: Current Exercise Habits: Home exercise routine, Type of exercise: walking, Time (Minutes): 30,  Frequency (Times/Week): 5, Weekly Exercise (Minutes/Week): 150, Intensity: Moderate, Exercise limited by: None identified   Goals Addressed             This Visit's Progress    To maintain my current health status by continuing to eat healthy, stay physically active and socially active.        Depression Screen    09/30/2022    1:15 PM 08/11/2022   10:59 AM 02/10/2022   10:37 AM 02/10/2022   10:02 AM 02/09/2021   10:20 AM 02/05/2020   11:30 AM 07/10/2014   11:47 AM  PHQ 2/9 Scores  PHQ - 2 Score 0 0 0 0 0 0 0  PHQ- 9 Score 0 0         Fall Risk    09/30/2022    1:05 PM 08/11/2022   10:59 AM 02/10/2022   10:37 AM 02/10/2022   10:03 AM 02/09/2021   10:20 AM  Fall Risk   Falls in the past year? 0 0 0 0 0  Number falls in past yr: 0  0 0   Injury with Fall? 0 0 0 0   Risk for fall due to : No Fall Risks No Fall Risks     Follow up Falls prevention discussed Falls evaluation completed       FALL RISK PREVENTION PERTAINING TO THE HOME:  Any stairs in or around the home? No  If so, are there any without handrails? No  Home free of loose throw rugs in walkways, pet beds, electrical cords, etc? Yes  Adequate lighting in your home to reduce risk of falls? Yes   ASSISTIVE DEVICES UTILIZED TO PREVENT FALLS:  Life alert? No  Use of a cane, walker or w/c? No  Grab bars in the bathroom? No  Shower chair or bench in shower? No  Elevated toilet seat or a handicapped toilet? No   TIMED UP AND GO:  Was the test performed? No . Phone Visit  Cognitive Function:        09/30/2022    1:19 PM  6CIT Screen  What Year? 0 points  What month? 0 points  What time?  0 points  Count back from 20 0 points  Months in reverse 0 points  Repeat phrase 0 points  Total Score 0 points    Immunizations Immunization History  Administered Date(s) Administered   Fluad Quad(high Dose 65+) 10/05/2019, 09/21/2022   Influenza, High Dose Seasonal PF 09/01/2018, 10/18/2021   Influenza-Unspecified  09/28/2020   Moderna Sars-Covid-2 Vaccination 03/26/2020, 04/12/2020, 12/09/2020   Pneumococcal Conjugate-13 12/26/2017, 01/26/2018   Pneumococcal Polysaccharide-23 02/10/2022   Zoster Recombinat (Shingrix) 09/21/2022    TDAP status: Due, Education has been provided regarding the importance of this vaccine. Advised may receive this vaccine at local pharmacy or Health Dept. Aware to provide a copy of the vaccination record if obtained from local pharmacy or Health Dept. Verbalized acceptance and understanding.  Flu Vaccine status: Up to date  Pneumococcal vaccine status: Up to date  Covid-19 vaccine status: Completed vaccines  Qualifies for Shingles Vaccine? Yes   Zostavax completed No   Shingrix Completed?: No.    Education has been provided regarding the importance of this vaccine. Patient has been advised to call insurance company to determine out of pocket expense if they have not yet received this vaccine. Advised may also receive vaccine at local pharmacy or Health Dept. Verbalized acceptance and understanding.  Screening Tests Health Maintenance  Topic Date Due   TETANUS/TDAP  02/10/2023 (Originally 02/27/1960)   Zoster Vaccines- Shingrix (2 of 2) 11/16/2022   Pneumonia Vaccine 55+ Years old  Completed   INFLUENZA VACCINE  Completed   HPV VACCINES  Aged Out   COLONOSCOPY (Pts 45-15yr Insurance coverage will need to be confirmed)  Discontinued   COVID-19 Vaccine  Discontinued    Health Maintenance  There are no preventive care reminders to display for this patient.  Colorectal cancer screening: No longer required.   Lung Cancer Screening: (Low Dose CT Chest recommended if Age 80-80years, 30 pack-year currently smoking OR have quit w/in 15years.) does not qualify.   Lung Cancer Screening Referral: no  Additional Screening:  Hepatitis C Screening: does not qualify; Completed no  Vision Screening: Recommended annual ophthalmology exams for early detection of glaucoma  and other disorders of the eye. Is the patient up to date with their annual eye exam?  Yes  Who is the provider or what is the name of the office in which the patient attends annual eye exams? AJulian Reil MD. If pt is not established with a provider, would they like to be referred to a provider to establish care? No .   Dental Screening: Recommended annual dental exams for proper oral hygiene  Community Resource Referral / Chronic Care Management: CRR required this visit?  No   CCM required this visit?  No      Plan:     I have personally reviewed and noted the following in the patient's chart:   Medical and social history Use of alcohol, tobacco or illicit drugs  Current medications and supplements including opioid prescriptions. Patient is not currently taking opioid prescriptions. Functional ability and status Nutritional status Physical activity Advanced directives List of other physicians Hospitalizations, surgeries, and ER visits in previous 12 months Vitals Screenings to include cognitive, depression, and falls Referrals and appointments  In addition, I have reviewed and discussed with patient certain preventive protocols, quality metrics, and best practice recommendations. A written personalized care plan for preventive services as well as general preventive health recommendations were provided to patient.     SSheral Flow LPN   [id number]  Nurse  Notes: N/A

## 2022-11-22 DIAGNOSIS — Z79899 Other long term (current) drug therapy: Secondary | ICD-10-CM | POA: Diagnosis not present

## 2022-11-22 DIAGNOSIS — Z7982 Long term (current) use of aspirin: Secondary | ICD-10-CM | POA: Diagnosis not present

## 2022-11-22 DIAGNOSIS — G244 Idiopathic orofacial dystonia: Secondary | ICD-10-CM | POA: Diagnosis not present

## 2023-01-27 ENCOUNTER — Other Ambulatory Visit: Payer: Self-pay | Admitting: Internal Medicine

## 2023-01-27 NOTE — Telephone Encounter (Signed)
Please refill as per office routine med refill policy (all routine meds to be refilled for 3 mo or monthly (per pt preference) up to one year from last visit, then month to month grace period for 3 mo, then further med refills will have to be denied) ? ?

## 2023-02-14 ENCOUNTER — Encounter: Payer: Self-pay | Admitting: Internal Medicine

## 2023-02-14 ENCOUNTER — Ambulatory Visit (INDEPENDENT_AMBULATORY_CARE_PROVIDER_SITE_OTHER): Payer: Medicare HMO | Admitting: Internal Medicine

## 2023-02-14 VITALS — BP 128/76 | HR 65 | Temp 97.8°F | Ht 69.5 in | Wt 125.0 lb

## 2023-02-14 DIAGNOSIS — R21 Rash and other nonspecific skin eruption: Secondary | ICD-10-CM | POA: Diagnosis not present

## 2023-02-14 DIAGNOSIS — I10 Essential (primary) hypertension: Secondary | ICD-10-CM

## 2023-02-14 DIAGNOSIS — E7800 Pure hypercholesterolemia, unspecified: Secondary | ICD-10-CM | POA: Diagnosis not present

## 2023-02-14 DIAGNOSIS — E538 Deficiency of other specified B group vitamins: Secondary | ICD-10-CM

## 2023-02-14 DIAGNOSIS — Z0001 Encounter for general adult medical examination with abnormal findings: Secondary | ICD-10-CM

## 2023-02-14 DIAGNOSIS — R739 Hyperglycemia, unspecified: Secondary | ICD-10-CM

## 2023-02-14 DIAGNOSIS — E559 Vitamin D deficiency, unspecified: Secondary | ICD-10-CM | POA: Diagnosis not present

## 2023-02-14 LAB — URINALYSIS, ROUTINE W REFLEX MICROSCOPIC
Bilirubin Urine: NEGATIVE
Hgb urine dipstick: NEGATIVE
Ketones, ur: NEGATIVE
Leukocytes,Ua: NEGATIVE
Nitrite: NEGATIVE
Specific Gravity, Urine: 1.015 (ref 1.000–1.030)
Total Protein, Urine: NEGATIVE
Urine Glucose: NEGATIVE
Urobilinogen, UA: 0.2 (ref 0.0–1.0)
pH: 8 (ref 5.0–8.0)

## 2023-02-14 LAB — HEMOGLOBIN A1C: Hgb A1c MFr Bld: 5.3 % (ref 4.6–6.5)

## 2023-02-14 LAB — CBC WITH DIFFERENTIAL/PLATELET
Basophils Absolute: 0 10*3/uL (ref 0.0–0.1)
Basophils Relative: 0.6 % (ref 0.0–3.0)
Eosinophils Absolute: 0.1 10*3/uL (ref 0.0–0.7)
Eosinophils Relative: 2.3 % (ref 0.0–5.0)
HCT: 41.6 % (ref 39.0–52.0)
Hemoglobin: 13.7 g/dL (ref 13.0–17.0)
Lymphocytes Relative: 60.1 % — ABNORMAL HIGH (ref 12.0–46.0)
Lymphs Abs: 3.1 10*3/uL (ref 0.7–4.0)
MCHC: 33 g/dL (ref 30.0–36.0)
MCV: 97.5 fl (ref 78.0–100.0)
Monocytes Absolute: 0.4 10*3/uL (ref 0.1–1.0)
Monocytes Relative: 8 % (ref 3.0–12.0)
Neutro Abs: 1.5 10*3/uL (ref 1.4–7.7)
Neutrophils Relative %: 29 % — ABNORMAL LOW (ref 43.0–77.0)
Platelets: 287 10*3/uL (ref 150.0–400.0)
RBC: 4.27 Mil/uL (ref 4.22–5.81)
RDW: 13.2 % (ref 11.5–15.5)
WBC: 5.2 10*3/uL (ref 4.0–10.5)

## 2023-02-14 LAB — BASIC METABOLIC PANEL
BUN: 15 mg/dL (ref 6–23)
CO2: 32 mEq/L (ref 19–32)
Calcium: 9.9 mg/dL (ref 8.4–10.5)
Chloride: 99 mEq/L (ref 96–112)
Creatinine, Ser: 0.81 mg/dL (ref 0.40–1.50)
GFR: 83.01 mL/min (ref >60.00)
Glucose, Bld: 92 mg/dL (ref 70–99)
Potassium: 4 mEq/L (ref 3.5–5.1)
Sodium: 138 mEq/L (ref 135–145)

## 2023-02-14 LAB — VITAMIN D 25 HYDROXY (VIT D DEFICIENCY, FRACTURES): VITD: 55.91 ng/mL (ref 30.00–100.00)

## 2023-02-14 LAB — LIPID PANEL
Cholesterol: 177 mg/dL (ref 0–200)
HDL: 77.6 mg/dL (ref >39.00)
LDL Cholesterol: 86 mg/dL (ref 0–99)
NonHDL: 99.51
Total CHOL/HDL Ratio: 2
Triglycerides: 66 mg/dL (ref 0.0–149.0)
VLDL: 13.2 mg/dL (ref 0.0–40.0)

## 2023-02-14 LAB — TSH: TSH: 3.38 u[IU]/mL (ref 0.35–5.50)

## 2023-02-14 LAB — VITAMIN B12: Vitamin B-12: 985 pg/mL — ABNORMAL HIGH (ref 211–911)

## 2023-02-14 LAB — HEPATIC FUNCTION PANEL
ALT: 22 U/L (ref 0–53)
AST: 36 U/L (ref 0–37)
Albumin: 4.8 g/dL (ref 3.5–5.2)
Alkaline Phosphatase: 52 U/L (ref 39–117)
Bilirubin, Direct: 0.2 mg/dL (ref 0.0–0.3)
Total Bilirubin: 1.3 mg/dL — ABNORMAL HIGH (ref 0.2–1.2)
Total Protein: 7.6 g/dL (ref 6.0–8.3)

## 2023-02-14 MED ORDER — TRIAMCINOLONE ACETONIDE 0.1 % EX OINT: 1.0000 | TOPICAL_OINTMENT | Freq: Two times a day (BID) | CUTANEOUS | 1 refills | Status: AC

## 2023-02-14 NOTE — Progress Notes (Addendum)
Patient ID: Patrick Dudley, male   DOB: [birth year], 82 y.o.   MRN: [id number]         Chief Complaint:: wellness exam and 40mofollow up (Rash/dark spots on legs and upper body)  , htn, hld, hyperglycemia, low vit d       HPI:  Patrick VILLARROELis a 82y.o. male here for wellness exam; for tdap and shingrix at the pharmacy, o/w up to date                        Also c/o rash dark areas with minor itching to legs and several to arms smaller as well for the last month.  Pt denies chest pain, increased sob or doe, wheezing, orthopnea, PND, increased LE swelling, palpitations, dizziness or syncope.   Pt denies polydipsia, polyuria, or new focal neuro s/s.    Pt denies fever, wt loss, night sweats, loss of appetite, or other constitutional symptoms     Wt Readings from Last 3 Encounters:  02/14/23 125 lb (56.7 kg)  09/30/22 125 lb (56.7 kg)  08/11/22 125 lb (56.7 kg)   BP Readings from Last 3 Encounters:  02/14/23 128/76  08/11/22 136/78  02/10/22 122/76   Immunization History  Administered Date(s) Administered   Fluad Quad(high Dose 65+) 10/05/2019, 09/21/2022   Influenza, High Dose Seasonal PF 09/01/2018, 10/18/2021   Influenza-Unspecified 09/28/2020   Moderna Sars-Covid-2 Vaccination 03/26/2020, 04/12/2020, 12/09/2020   Pneumococcal Conjugate-13 12/26/2017, 01/26/2018   Pneumococcal Polysaccharide-23 02/10/2022   Zoster Recombinat (Shingrix) 09/21/2022   Health Maintenance Due  Topic Date Due   DTaP/Tdap/Td (1 - Tdap) Never done      Past Medical History:  Diagnosis Date   Colon polyps    Depression    Dyslipidemia    Glaucoma 02/05/2020   History of glaucoma    S/P   LASER TX  2003   History of kidney stones    Hypertension    Lymphocytosis (symptomatic)    Orofacial dyskinesia    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   Prostate cancer ([place] DX  2009   GLEASON  3+4,  T1c,  PSA  4.59   Past Surgical History:  Procedure Laterality Date   COLONOSCOPY  2007   CYSTOSCOPY N/A  06/05/2014   Procedure: CYSTOSCOPY;  Surgeon: LRaynelle Bring MD;  [facility]  Service: Urology;  Laterality: N/A;   PROSTATE BIOPSY  MULTIPLE-  LAST ONE  03-11-2014   RADIOACTIVE SEED IMPLANT N/A 06/05/2014   Procedure: RADIOACTIVE SEED IMPLANT;  Surgeon: LRaynelle Bring MD;  [facility]  Service: Urology;  Laterality: N/A;   REFRACTIVE SURGERY Left 2003   GLAUCOMA   URETEROLITHOTOMY  1980    reports that he quit smoking about 41 years ago. His smoking use included cigarettes. He has a 9.00 pack-year smoking history. He has never used smokeless tobacco. He reports that he does not drink alcohol and does not use drugs. family history includes Diabetes in his brother and sister; Heart disease in his brother, brother, and sister; Nephrolithiasis in his brother; Prostate cancer in his brother and father. No Known Allergies Current Outpatient Medications on File Prior to Visit  Medication Sig Dispense Refill   amLODipine (NORVASC) 10 MG tablet TAKE 1 TABLET (10 MG TOTAL) BY MOUTH EVERY MORNING. 90 tablet 3   aspirin 81 MG tablet Take 81 mg by mouth daily.     Cholecalciferol 50 MCG (2000 UT) CAPS Take by  mouth.     clonazePAM (KLONOPIN) 0.5 MG tablet Take 0.5 mg by mouth 3 (three) times daily.      mirtazapine (REMERON) 15 MG tablet TAKE 1 TABLET BY MOUTH EVERYDAY AT BEDTIME 90 tablet 3   sildenafil (VIAGRA) 100 MG tablet SMARTSIG:1 Tablet(s) By Mouth As Directed PRN     No current facility-administered medications on file prior to visit.        ROS:  All others reviewed and negative.  Objective        PE:  BP 128/76 (BP Location: Left Arm, Patient Position: Sitting, Cuff Size: Large)   Pulse 65   Temp 97.8 F (36.6 C) (Oral)   Ht 5' 9.5" (1.765 m)   Wt 125 lb (56.7 kg)   SpO2 95%   BMI 18.19 kg/m                 Constitutional: Pt appears in NAD               HENT: Head: NCAT.                Right Ear: External ear normal.                  Left Ear: External ear normal.                Eyes: . Pupils are equal, round, and reactive to light. Conjunctivae and EOM are normal               Nose: without d/c or deformity               Neck: Neck supple. Gross normal ROM               Cardiovascular: Normal rate and regular rhythm.                 Pulmonary/Chest: Effort normal and breath sounds without rales or wheezing.                Abd:  Soft, NT, ND, + BS, no organomegaly               Neurological: Pt is alert. At baseline orientation, motor grossly intact               Skin: Skin is warm. Multiple dark areas to legs and arms nontender non scaly without swelling or drainage,  LE edema - none               Psychiatric: Pt behavior is normal without agitation   Micro: none  Cardiac tracings I have personally interpreted today:  none  Pertinent Radiological findings (summarize): none   Lab Results  Component Value Date   WBC 5.2 02/14/2023   HGB 13.7 02/14/2023   HCT 41.6 02/14/2023   PLT 287.0 02/14/2023   GLUCOSE 92 02/14/2023   CHOL 177 02/14/2023   TRIG 66.0 02/14/2023   HDL 77.60 02/14/2023   LDLCALC 86 02/14/2023   ALT 22 02/14/2023   AST 36 02/14/2023   NA 138 02/14/2023   K 4.0 02/14/2023   CL 99 02/14/2023   CREATININE 0.81 02/14/2023   BUN 15 02/14/2023   CO2 32 02/14/2023   TSH 3.38 02/14/2023   PSA 0.07 (L) 02/10/2022   INR 0.98 05/29/2014   HGBA1C 5.3 02/14/2023   Assessment/Plan:  Patrick Dudley is a 82 y.o. Black or African American [2] male with  has a past medical history of Colon  polyps, Depression, Dyslipidemia, Glaucoma (02/05/2020), History of glaucoma, History of kidney stones, Hypertension, Lymphocytosis (symptomatic), Orofacial dyskinesia, Oromandibular dystonia (04/13/2016), and Prostate cancer ([place]) (DX  2009).  Encounter for well adult exam with abnormal findings Age and sex appropriate education and counseling updated with regular exercise and diet Referrals for preventative  services - none needed Immunizations addressed - for tdap and shingrix at pharmacy Smoking counseling  - none needed Evidence for depression or other mood disorder - none significant Most recent labs reviewed. I have personally reviewed and have noted: 1) the patient's medical and social history 2) The patient's current medications and supplements 3) The patient's height, weight, and BMI have been recorded in the chart   Essential hypertension BP Readings from Last 3 Encounters:  02/14/23 128/76  08/11/22 136/78  02/10/22 122/76   Stable, pt to continue medical treatment norvasc 10 mg qd   HLD (hyperlipidemia) Lab Results  Component Value Date   LDLCALC 86 02/14/2023   Stable, pt to continue low chol diet  Hyperglycemia Lab Results  Component Value Date   HGBA1C 5.3 02/14/2023   Stable, pt to continue current medical treatment  - diet, wt control   Vitamin D deficiency Last vitamin D Lab Results  Component Value Date   VD25OH 55.91 02/14/2023   Stable, cont oral replacement   RASH-NONVESICULAR Pt appears to have multiple areas of post inflammatory hyperpigmentation to legs and arms, etiology unclear, for triam cr prn asd,  to f/u any worsening symptoms or concerns  Followup: Return in about 6 months (around 08/15/2023).  Cathlean Cower, MD 02/18/2023 8:20 PM [facility]

## 2023-02-14 NOTE — Patient Instructions (Addendum)
Please have your Shingrix (shingles) shots done at your local pharmacy. With #2 in march  Please take all new medication as prescribed  - the ointment as needed  Please continue all other medications as before, and refills have been done if requested.  Please have the pharmacy call with any other refills you may need.  Please continue your efforts at being more active, low cholesterol diet, and weight control.  You are otherwise up to date with prevention measures today.  Please keep your appointments with your specialists as you may have planned  Please go to the LAB at the blood drawing area for the tests to be done  You will be contacted by phone if any changes need to be made immediately.  Otherwise, you will receive a letter about your results with an explanation, but please check with MyChart first.  Please remember to sign up for MyChart if you have not done so, as this will be important to you in the future with finding out test results, communicating by private email, and scheduling acute appointments online when needed.  Please make an Appointment to return in 6 months, or sooner if needed

## 2023-02-18 ENCOUNTER — Encounter: Payer: Self-pay | Admitting: Internal Medicine

## 2023-02-18 NOTE — Assessment & Plan Note (Signed)
Lab Results  Component Value Date   LDLCALC 86 02/14/2023   Stable, pt to continue low chol diet

## 2023-02-18 NOTE — Assessment & Plan Note (Signed)
Lab Results  Component Value Date   HGBA1C 5.3 02/14/2023   Stable, pt to continue current medical treatment  - diet, wt control

## 2023-02-18 NOTE — Assessment & Plan Note (Signed)

## 2023-02-18 NOTE — Assessment & Plan Note (Signed)
BP Readings from Last 3 Encounters:  02/14/23 128/76  08/11/22 136/78  02/10/22 122/76   Stable, pt to continue medical treatment norvasc 10 mg qd

## 2023-02-18 NOTE — Assessment & Plan Note (Signed)
Pt appears to have multiple areas of post inflammatory hyperpigmentation to legs and arms, etiology unclear, for triam cr prn asd,  to f/u any worsening symptoms or concerns

## 2023-02-18 NOTE — Assessment & Plan Note (Signed)
Last vitamin D Lab Results  Component Value Date   VD25OH 55.91 02/14/2023   Stable, cont oral replacement

## 2023-04-10 ENCOUNTER — Other Ambulatory Visit: Payer: Self-pay | Admitting: Internal Medicine

## 2023-04-14 ENCOUNTER — Telehealth: Payer: Self-pay | Admitting: Internal Medicine

## 2023-04-14 MED ORDER — MIRTAZAPINE 15 MG PO TABS: ORAL_TABLET | ORAL | 3 refills | Status: DC

## 2023-04-14 NOTE — Telephone Encounter (Signed)
Prescription Request  04/14/2023  LOV: 02/14/2023  What is the name of the medication or equipment? mirtazapine (REMERON) 15 MG tablet   Have you contacted your pharmacy to request a refill? Yes   Which pharmacy would you like this sent to?  [id number] - [place], [place] - [address] AT [facility] [address] [place] [address] Phone: [phone] Fax: [phone]    Patient notified that their request is being sent to the clinical staff for review and that they should receive a response within 2 business days.   Please advise at Mobile There is no such number on file (mobile).

## 2023-04-25 DIAGNOSIS — G244 Idiopathic orofacial dystonia: Secondary | ICD-10-CM | POA: Diagnosis not present

## 2023-04-25 DIAGNOSIS — Z79899 Other long term (current) drug therapy: Secondary | ICD-10-CM | POA: Diagnosis not present

## 2023-05-19 DIAGNOSIS — C61 Malignant neoplasm of prostate: Secondary | ICD-10-CM | POA: Diagnosis not present

## 2023-06-02 DIAGNOSIS — C61 Malignant neoplasm of prostate: Secondary | ICD-10-CM | POA: Diagnosis not present

## 2023-06-02 DIAGNOSIS — N5201 Erectile dysfunction due to arterial insufficiency: Secondary | ICD-10-CM | POA: Diagnosis not present

## 2023-08-07 DIAGNOSIS — H40013 Open angle with borderline findings, low risk, bilateral: Secondary | ICD-10-CM | POA: Diagnosis not present

## 2023-08-15 ENCOUNTER — Encounter: Payer: Self-pay | Admitting: Internal Medicine

## 2023-08-15 ENCOUNTER — Ambulatory Visit: Payer: Medicare HMO | Admitting: Internal Medicine

## 2023-08-15 VITALS — BP 130/82 | HR 60 | Temp 97.7°F | Ht 69.5 in | Wt 124.0 lb

## 2023-08-15 DIAGNOSIS — E559 Vitamin D deficiency, unspecified: Secondary | ICD-10-CM | POA: Diagnosis not present

## 2023-08-15 DIAGNOSIS — I10 Essential (primary) hypertension: Secondary | ICD-10-CM | POA: Diagnosis not present

## 2023-08-15 DIAGNOSIS — R739 Hyperglycemia, unspecified: Secondary | ICD-10-CM

## 2023-08-15 DIAGNOSIS — E7800 Pure hypercholesterolemia, unspecified: Secondary | ICD-10-CM | POA: Diagnosis not present

## 2023-08-15 NOTE — Progress Notes (Signed)
Patient ID: Patrick Dudley, male   DOB: [birth year], 82 y.o.   MRN: [id number]        Chief Complaint: follow up HTN, HLD and hyperglycemia , low vit d       HPI:  Patrick Dudley is a 82 y.o. male here overall doing ok, Pt denies chest pain, increased sob or doe, wheezing, orthopnea, PND, increased LE swelling, palpitations, dizziness or syncope.   Pt denies polydipsia, polyuria, or new focal neuro s/s.    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   Prostate cancer ([facility]) DX  2009   GLEASON  3+4,  T1c,  PSA  4.59   Past Surgical History:  Procedure Laterality Date   COLONOSCOPY  2007   CYSTOSCOPY N/A 06/05/2014   Procedure: CYSTOSCOPY;  Surgeon: Heloise Purpura, MD;  Location: [facility];  Service: Urology;  Laterality: N/A;   PROSTATE BIOPSY  MULTIPLE-  LAST ONE  03-11-2014   RADIOACTIVE SEED IMPLANT N/A 06/05/2014   Procedure: RADIOACTIVE SEED IMPLANT;  Surgeon: Heloise Purpura, MD;  [facility];  Service: Urology;  Laterality: N/A;   REFRACTIVE SURGERY Left 2003   GLAUCOMA   URETEROLITHOTOMY  1980    reports that he quit smoking about 41 years ago. His smoking use included cigarettes. He started smoking about 59 years ago. He has a 9 pack-year smoking history. He has never used smokeless tobacco. He reports that he does not drink alcohol and does not use drugs. family history includes  Diabetes in his brother and sister; Heart disease in his brother, brother, and sister; Nephrolithiasis in his brother; Prostate cancer in his brother and father. No Known Allergies Current Outpatient Medications on File Prior to Visit  Medication Sig Dispense Refill   amLODipine (NORVASC) 10 MG tablet TAKE 1 TABLET (10 MG TOTAL) BY MOUTH EVERY MORNING. 90 tablet 3   aspirin 81 MG tablet Take 81 mg by mouth daily.     Cholecalciferol 50 MCG (2000 UT) CAPS Take by mouth.     clonazePAM (KLONOPIN) 0.5 MG tablet Take 0.5 mg by mouth 3 (three) times daily.      mirtazapine (REMERON) 15 MG tablet TAKE 1 TABLET BY MOUTH EVERYDAY AT BEDTIME 90 tablet 3   sildenafil (VIAGRA) 100 MG tablet SMARTSIG:1 Tablet(s) By Mouth As Directed PRN     triamcinolone ointment (KENALOG) 0.1 % Apply 1 Application topically 2 (two) times daily. 453.6 g 1   No current facility-administered medications on file prior to visit.        ROS:  All others reviewed and negative.  Objective        PE:  BP 130/82 (BP Location: Left Arm, Patient Position: Sitting, Cuff Size: Normal)   Pulse 60   Temp 97.7  F (36.5 C) (Oral)   Ht 5' 9.5" (1.765 m)   Wt 124 lb (56.2 kg)   SpO2 98%   BMI 18.05 kg/m                 Constitutional: Pt appears in NAD               HENT: Head: NCAT.                Right Ear: External ear normal.                 Left Ear: External ear normal.                Eyes: . Pupils are equal, round, and reactive to light. Conjunctivae and EOM are normal               Nose: without d/c or deformity               Neck: Neck supple. Gross normal ROM               Cardiovascular: Normal rate and regular rhythm.                 Pulmonary/Chest: Effort normal and breath sounds without rales or wheezing.                Abd:  Soft, NT, ND, + BS, no organomegaly               Neurological: Pt is alert. At baseline orientation, motor grossly intact               Skin: Skin is warm. No rashes, no other new  lesions, LE edema - none               Psychiatric: Pt behavior is normal without agitation   Micro: none  Cardiac tracings I have personally interpreted today:  none  Pertinent Radiological findings (summarize): none   Lab Results  Component Value Date   WBC 5.2 02/14/2023   HGB 13.7 02/14/2023   HCT 41.6 02/14/2023   PLT 287.0 02/14/2023   GLUCOSE 92 02/14/2023   CHOL 177 02/14/2023   TRIG 66.0 02/14/2023   HDL 77.60 02/14/2023   LDLCALC 86 02/14/2023   ALT 22 02/14/2023   AST 36 02/14/2023   NA 138 02/14/2023   K 4.0 02/14/2023   CL 99 02/14/2023   CREATININE 0.81 02/14/2023   BUN 15 02/14/2023   CO2 32 02/14/2023   TSH 3.38 02/14/2023   PSA 0.07 (L) 02/10/2022   INR 0.98 05/29/2014   HGBA1C 5.3 02/14/2023   Assessment/Plan:  Patrick Dudley is a 82 y.o. Black or African American [2] male with  has a past medical history of Colon polyps, Depression, Dyslipidemia, Glaucoma (02/05/2020), History of glaucoma, History of kidney stones, Hypertension, Lymphocytosis (symptomatic), Orofacial dyskinesia, Oromandibular dystonia (04/13/2016), and Prostate cancer ([facility]) (DX  2009).  Essential hypertension BP Readings from Last 3 Encounters:  08/15/23 130/82  02/14/23 128/76  08/11/22 136/78   Stable, pt to continue medical treatment norvasc 10 qd   HLD (hyperlipidemia) Lab Results  Component Value Date   LDLCALC 86 02/14/2023   Uncontrolled,, pt for lower chol diet, declines statin  Hyperglycemia Lab Results  Component Value Date   HGBA1C 5.3 02/14/2023   Stable, pt to continue current medical treatment - diet,wt control   Vitamin D deficiency Last vitamin D Lab Results  Component Value Date  VD25OH 55.91 02/14/2023   Stable, cont oral replacement  Followup: Return in about 6 months (around 02/15/2024).  Oliver Barre, MD 08/18/2023 9:30 PM [facility]

## 2023-08-15 NOTE — Patient Instructions (Signed)
Please continue all other medications as before, and refills have been done if requested. ° °Please have the pharmacy call with any other refills you may need. ° °Please continue your efforts at being more active, low cholesterol diet, and weight control. ° °You are otherwise up to date with prevention measures today. ° °Please keep your appointments with your specialists as you may have planned ° °Please make an Appointment to return in 6 months, or sooner if needed °

## 2023-08-18 ENCOUNTER — Encounter: Payer: Self-pay | Admitting: Internal Medicine

## 2023-08-18 NOTE — Assessment & Plan Note (Signed)
Last vitamin D Lab Results  Component Value Date   VD25OH 55.91 02/14/2023   Stable, cont oral replacement

## 2023-08-18 NOTE — Assessment & Plan Note (Signed)
Lab Results  Component Value Date   HGBA1C 5.3 02/14/2023   Stable, pt to continue current medical treatment  - diet, wt control

## 2023-08-18 NOTE — Assessment & Plan Note (Signed)
BP Readings from Last 3 Encounters:  08/15/23 130/82  02/14/23 128/76  08/11/22 136/78   Stable, pt to continue medical treatment norvasc 10 qd

## 2023-08-18 NOTE — Assessment & Plan Note (Signed)
Lab Results  Component Value Date   LDLCALC 86 02/14/2023   Uncontrolled,, pt for lower chol diet, declines statin

## 2023-09-15 ENCOUNTER — Ambulatory Visit (INDEPENDENT_AMBULATORY_CARE_PROVIDER_SITE_OTHER): Payer: Medicare HMO

## 2023-09-15 VITALS — Ht 69.5 in | Wt 124.0 lb

## 2023-09-15 DIAGNOSIS — Z0000 Encounter for general adult medical examination without abnormal findings: Secondary | ICD-10-CM

## 2023-09-15 NOTE — Progress Notes (Signed)
Subjective:   Patrick Dudley is a 82 y.o. male who presents for Medicare Annual/Subsequent preventive examination.  Visit Complete: Virtual  I connected with  Clelia Croft on 09/15/23 by a audio enabled telemedicine application and verified that I am speaking with the correct person using two identifiers.  Patient Location: Home  Provider Location: Office/Clinic  I discussed the limitations of evaluation and management by telemedicine. The patient expressed understanding and agreed to proceed.  Vital Signs: Because this visit was a virtual/telehealth visit, some criteria may be missing or patient reported. Any vitals not documented were not able to be obtained and vitals that have been documented are patient reported.    Cardiac Risk Factors include: advanced age (>81men, >24 women);male gender;hypertension;dyslipidemia     Objective:    Today's Vitals   09/15/23 1120  Weight: 124 lb (56.2 kg)  Height: 5' 9.5" (1.765 m)   Body mass index is 18.05 kg/m.     09/15/2023   11:25 AM 09/30/2022    1:04 PM 06/27/2018    1:22 PM 06/05/2014    1:35 PM 04/04/2014   10:10 AM  Advanced Directives  Does Patient Have a Medical Advance Directive? Yes Yes No Patient does not have advance directive;Patient would not like information Patient has advance directive, copy not in chart  Type of Advance Directive Living will Healthcare Power of [place];Living will     Does patient want to make changes to medical advance directive?     No change requested  Copy of Healthcare Power of Attorney in Chart?  No - copy requested   Copy requested from family  Would patient like information on creating a medical advance directive?   No - Patient declined      Current Medications (verified) Outpatient Encounter Medications as of 09/15/2023  Medication Sig   amLODipine (NORVASC) 10 MG tablet TAKE 1 TABLET (10 MG TOTAL) BY MOUTH EVERY MORNING.   aspirin 81 MG tablet Take 81 mg by mouth daily.    Cholecalciferol 50 MCG (2000 UT) CAPS Take by mouth.   clonazePAM (KLONOPIN) 0.5 MG tablet Take 0.5 mg by mouth 3 (three) times daily.    mirtazapine (REMERON) 15 MG tablet TAKE 1 TABLET BY MOUTH EVERYDAY AT BEDTIME   sildenafil (VIAGRA) 100 MG tablet SMARTSIG:1 Tablet(s) By Mouth As Directed PRN   triamcinolone ointment (KENALOG) 0.1 % Apply 1 Application topically 2 (two) times daily.   No facility-administered encounter medications on file as of 09/15/2023.    Allergies (verified) Patient has no known allergies.   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   Prostate cancer (HCC) DX  2009   GLEASON  3+4,  T1c,  PSA  4.59   Past Surgical History:  Procedure Laterality Date   COLONOSCOPY  2007   CYSTOSCOPY N/A 06/05/2014   Procedure: CYSTOSCOPY;  Surgeon: Heloise Purpura, MD;  Location: [facility];  Service: Urology;  Laterality: N/A;   PROSTATE BIOPSY  MULTIPLE-  LAST ONE  03-11-2014   RADIOACTIVE SEED IMPLANT N/A 06/05/2014   Procedure: RADIOACTIVE SEED IMPLANT;  Surgeon: Heloise Purpura, MD;  Location: [facility];  Service:  Urology;  Laterality: N/A;   REFRACTIVE SURGERY Left 2003   GLAUCOMA   URETEROLITHOTOMY  1980   Family History  Problem Relation Age of Onset   Prostate cancer Father    Nephrolithiasis Brother    Prostate cancer Brother    Diabetes Sister    Heart disease Sister    Diabetes Brother    Heart disease Brother    Heart disease Brother    Social History   Socioeconomic History   Marital status: Widowed    Spouse name: Not on file   Number of children: 1   Years of education: Not on file   Highest education level: Not on file  Occupational History   Occupation:  retired  Tobacco Use   Smoking status: Former    Current packs/day: 0.00    Average packs/day: 0.5 packs/day for 18.0 years (9.0 ttl pk-yrs)    Types: Cigarettes    Start date: 12/27/1963    Quit date: 12/26/1981    Years since quitting: 41.7   Smokeless tobacco: Never  Vaping Use   Vaping status: Never Used  Substance and Sexual Activity   Alcohol use: No   Drug use: No   Sexual activity: Never  Other Topics Concern   Not on file  Social History Narrative   Lives alone.   Social Determinants of Health   Financial Resource Strain: Low Risk  (09/15/2023)   Overall Financial Resource Strain (CARDIA)    Difficulty of Paying Living Expenses: Not hard at all  Food Insecurity: No Food Insecurity (09/15/2023)   Hunger Vital Sign    Worried About Running Out of Food in the Last Year: Never true    Ran Out of Food in the Last Year: Never true  Transportation Needs: No Transportation Needs (09/15/2023)   PRAPARE - Administrator, Civil Service (Medical): No    Lack of Transportation (Non-Medical): No  Physical Activity: Insufficiently Active (09/15/2023)   Exercise Vital Sign    Days of Exercise per Week: 3 days    Minutes of Exercise per Session: 40 min  Stress: No Stress Concern Present (09/15/2023)   [employer] of Occupational Health - Occupational Stress Questionnaire    Feeling of Stress : Not at all  Social Connections: Moderately Integrated (09/15/2023)   Social Connection and Isolation Panel [NHANES]    Frequency of Communication with Friends and Family: More than three times a week    Frequency of Social Gatherings with Friends and Family: More than three times a week    Attends Religious Services: More than 4 times per year    Active Member of [employer] or Organizations: Yes    Attends Banker Meetings: More than 4 times per year    Marital Status: Widowed    Tobacco Counseling Counseling given: Not Answered   Clinical Intake:  Pre-visit  preparation completed: Yes  Pain : No/denies pain     BMI - recorded: 18.05 Nutritional Status: BMI <19  Underweight Nutritional Risks: None Diabetes: No  How often do you need to have someone help you when you read instructions, pamphlets, or other written materials from your doctor or pharmacy?: 1 - Never  Interpreter Needed?: No  Information entered by :: Analeya Luallen, RMA   Activities of Daily Living    09/15/2023   11:22 AM 09/30/2022    1:18 PM  In your present state of health, do you have any difficulty performing the following activities:  Hearing? 0 0  Vision? 0 0  Difficulty concentrating or making decisions? 0 0  Walking or climbing stairs? 0 0  Dressing or bathing? 0 0  Doing errands, shopping? 0 0  Preparing Food and eating ? N N  Using the Toilet? N N  In the past six months, have you accidently leaked urine? N N  Do you have problems with loss of bowel control? N N  Managing your Medications? N N  Managing your Finances? N N  Housekeeping or managing your Housekeeping?  N    Patient Care Team: Corwin Levins, MD as PCP - General (Internal Medicine) Genia Del Daisy Blossom, MD as Consulting Physician (Ophthalmology)  Indicate any recent Medical Services you may have received from other than [facility] providers in the past year (date may be approximate).     Assessment:   This is a routine wellness examination for [facility].  Hearing/Vision screen Hearing Screening - Comments:: Denies hearing difficulties     Goals Addressed             This Visit's Progress    To maintain my current health status by continuing to eat healthy, stay physically active and socially active.   On track     Depression Screen    09/15/2023   11:32 AM 08/15/2023   10:05 AM 02/14/2023   11:54 AM 02/14/2023   11:04 AM 09/30/2022    1:15 PM 08/11/2022   10:59 AM 02/10/2022   10:37 AM  PHQ 2/9 Scores  PHQ - 2 Score 0 0 0 0 0 0 0  PHQ- 9 Score 0   0 0 0     Fall Risk     09/15/2023   11:25 AM 08/15/2023   10:05 AM 02/14/2023   11:04 AM 09/30/2022    1:05 PM 08/11/2022   10:59 AM  Fall Risk   Falls in the past year? 0 0 0 0 0  Number falls in past yr: 0 0 0 0   Injury with Fall? 0 0 0 0 0  Risk for fall due to : No Fall Risks No Fall Risks No Fall Risks No Fall Risks No Fall Risks  Follow up Falls prevention discussed;Falls evaluation completed Falls evaluation completed Falls evaluation completed Falls prevention discussed Falls evaluation completed    MEDICARE RISK AT HOME: Medicare Risk at Home Any stairs in or around the home?: No Home free of loose throw rugs in walkways, pet beds, electrical cords, etc?: Yes Adequate lighting in your home to reduce risk of falls?: Yes Life alert?: No Use of a cane, walker or w/c?: No Grab bars in the bathroom?: No Shower chair or bench in shower?: No Elevated toilet seat or a handicapped toilet?: No  TIMED UP AND GO:  Was the test performed?  No    Cognitive Function:        09/15/2023   11:26 AM 09/30/2022    1:19 PM  6CIT Screen  What Year? 0 points 0 points  What month? 0 points 0 points  What time? 0 points 0 points  Count back from 20 0 points 0 points  Months in reverse 2 points 0 points  Repeat phrase 0 points 0 points  Total Score 2 points 0 points    Immunizations Immunization History  Administered Date(s) Administered   Fluad Quad(high Dose 65+) 10/05/2019, 09/21/2022   Influenza, High Dose Seasonal PF 09/01/2018, 10/18/2021   Influenza-Unspecified 09/28/2020   Moderna Sars-Covid-2 Vaccination 03/26/2020, 04/12/2020, 12/09/2020   Pneumococcal Conjugate-13 12/26/2017,  01/26/2018   Pneumococcal Polysaccharide-23 02/10/2022   Zoster Recombinant(Shingrix) 09/21/2022, 11/21/2022    TDAP status: Due, Education has been provided regarding the importance of this vaccine. Advised may receive this vaccine at local pharmacy or Health Dept. Aware to provide a copy of the vaccination record if  obtained from local pharmacy or Health Dept. Verbalized acceptance and understanding.  Flu Vaccine status: Due, Education has been provided regarding the importance of this vaccine. Advised may receive this vaccine at local pharmacy or Health Dept. Aware to provide a copy of the vaccination record if obtained from local pharmacy or Health Dept. Verbalized acceptance and understanding.  Pneumococcal vaccine status: Up to date  Covid-19 vaccine status: Completed vaccines  Qualifies for Shingles Vaccine? Yes   Zostavax completed Yes   Shingrix Completed?: Yes  Screening Tests Health Maintenance  Topic Date Due   DTaP/Tdap/Td (1 - Tdap) Never done   INFLUENZA VACCINE  07/27/2023   Medicare Annual Wellness (AWV)  09/14/2024   Pneumonia Vaccine 37+ Years old  Completed   Zoster Vaccines- Shingrix  Completed   HPV VACCINES  Aged Out   Colonoscopy  Discontinued   COVID-19 Vaccine  Discontinued   Hepatitis C Screening  Discontinued    Health Maintenance  Health Maintenance Due  Topic Date Due   DTaP/Tdap/Td (1 - Tdap) Never done   INFLUENZA VACCINE  07/27/2023    Colorectal cancer screening: No longer required.   Lung Cancer Screening: (Low Dose CT Chest recommended if Age 32-80 years, 20 pack-year currently smoking OR have quit w/in 15years.) does not qualify.   Lung Cancer Screening Referral: N/A  Additional Screening:  Hepatitis C Screening: does qualify; Completed 02/09/2021  Vision Screening: Recommended annual ophthalmology exams for early detection of glaucoma and other disorders of the eye. Is the patient up to date with their annual eye exam?  Yes  Who is the provider or what is the name of the office in which the patient attends annual eye exams? [facility]/ Dr. Genia Del If pt is not established with a provider, would they like to be referred to a provider to establish care? No .   Dental Screening: Recommended annual dental exams for proper oral  hygiene   Community Resource Referral / Chronic Care Management: CRR required this visit?  No   CCM required this visit?  No     Plan:     I have personally reviewed and noted the following in the patient's chart:   Medical and social history Use of alcohol, tobacco or illicit drugs  Current medications and supplements including opioid prescriptions. Patient is not currently taking opioid prescriptions. Functional ability and status Nutritional status Physical activity Advanced directives List of other physicians Hospitalizations, surgeries, and ER visits in previous 12 months Vitals Screenings to include cognitive, depression, and falls Referrals and appointments  In addition, I have reviewed and discussed with patient certain preventive protocols, quality metrics, and best practice recommendations. A written personalized care plan for preventive services as well as general preventive health recommendations were provided to patient.     Mieko Kneebone L Silvanna Ohmer, CMA   09/15/2023   After Visit Summary: (Mail) Due to this being a telephonic visit, the after visit summary with patients personalized plan was offered to patient via mail   Nurse Notes: Patient is due for a Tdap vaccine and Flu vaccine.  Patient is aware that he can get these done at his local pharmacy.  He is up to date on all his  health maintenance.

## 2023-09-15 NOTE — Patient Instructions (Signed)
Patrick Dudley , Thank you for taking time to come for your Medicare Wellness Visit. I appreciate your ongoing commitment to your health goals. Please review the following plan we discussed and let me know if I can assist you in the future.   Referrals/Orders/Follow-Ups/Clinician Recommendations: You are due for a Flu vaccine and a Tetanus vaccine.  It was nice talking with you today.  Keep up the good work.  This is a list of the screening recommended for you and due dates:  Health Maintenance  Topic Date Due   DTaP/Tdap/Td vaccine (1 - Tdap) Never done   Flu Shot  07/27/2023   Medicare Annual Wellness Visit  09/14/2024   Pneumonia Vaccine  Completed   Zoster (Shingles) Vaccine  Completed   HPV Vaccine  Aged Out   Colon Cancer Screening  Discontinued   COVID-19 Vaccine  Discontinued   Hepatitis C Screening  Discontinued    Advanced directives: (Copy Requested) Please bring a copy of your health care power of attorney and living will to the office to be added to your chart at your convenience.  Next Medicare Annual Wellness Visit scheduled for next year: Yes

## 2023-10-17 DIAGNOSIS — G7109 Other specified muscular dystrophies: Secondary | ICD-10-CM | POA: Diagnosis not present

## 2023-10-17 DIAGNOSIS — Z79899 Other long term (current) drug therapy: Secondary | ICD-10-CM | POA: Diagnosis not present

## 2023-10-17 DIAGNOSIS — G244 Idiopathic orofacial dystonia: Secondary | ICD-10-CM | POA: Diagnosis not present

## 2024-01-26 ENCOUNTER — Other Ambulatory Visit: Payer: Self-pay

## 2024-01-26 ENCOUNTER — Other Ambulatory Visit: Payer: Self-pay | Admitting: Internal Medicine

## 2024-02-16 ENCOUNTER — Encounter: Payer: Self-pay | Admitting: Internal Medicine

## 2024-02-16 ENCOUNTER — Ambulatory Visit (INDEPENDENT_AMBULATORY_CARE_PROVIDER_SITE_OTHER): Payer: Medicare HMO | Admitting: Internal Medicine

## 2024-02-16 VITALS — BP 120/72 | HR 59 | Temp 97.7°F | Ht 69.5 in | Wt 128.0 lb

## 2024-02-16 DIAGNOSIS — E559 Vitamin D deficiency, unspecified: Secondary | ICD-10-CM | POA: Diagnosis not present

## 2024-02-16 DIAGNOSIS — I10 Essential (primary) hypertension: Secondary | ICD-10-CM

## 2024-02-16 DIAGNOSIS — E7800 Pure hypercholesterolemia, unspecified: Secondary | ICD-10-CM | POA: Diagnosis not present

## 2024-02-16 DIAGNOSIS — E538 Deficiency of other specified B group vitamins: Secondary | ICD-10-CM

## 2024-02-16 DIAGNOSIS — R739 Hyperglycemia, unspecified: Secondary | ICD-10-CM

## 2024-02-16 DIAGNOSIS — Z0000 Encounter for general adult medical examination without abnormal findings: Secondary | ICD-10-CM | POA: Diagnosis not present

## 2024-02-16 LAB — HEPATIC FUNCTION PANEL
ALT: 18 U/L (ref 0–53)
AST: 30 U/L (ref 0–37)
Albumin: 5.1 g/dL (ref 3.5–5.2)
Alkaline Phosphatase: 59 U/L (ref 39–117)
Bilirubin, Direct: 0.2 mg/dL (ref 0.0–0.3)
Total Bilirubin: 1 mg/dL (ref 0.2–1.2)
Total Protein: 8.1 g/dL (ref 6.0–8.3)

## 2024-02-16 LAB — BASIC METABOLIC PANEL
BUN: 14 mg/dL (ref 6–23)
CO2: 33 meq/L — ABNORMAL HIGH (ref 19–32)
Calcium: 9.9 mg/dL (ref 8.4–10.5)
Chloride: 100 meq/L (ref 96–112)
Creatinine, Ser: 0.77 mg/dL (ref 0.40–1.50)
GFR: 83.69 mL/min (ref >60.00)
Glucose, Bld: 98 mg/dL (ref 70–99)
Potassium: 3.9 meq/L (ref 3.5–5.1)
Sodium: 141 meq/L (ref 135–145)

## 2024-02-16 LAB — MICROALBUMIN / CREATININE URINE RATIO
Creatinine,U: 58.9 mg/dL
Microalb Creat Ratio: 25.7 mg/g (ref 0.0–30.0)
Microalb, Ur: 1.5 mg/dL (ref 0.0–1.9)

## 2024-02-16 LAB — URINALYSIS, ROUTINE W REFLEX MICROSCOPIC
Bilirubin Urine: NEGATIVE
Hgb urine dipstick: NEGATIVE
Ketones, ur: NEGATIVE
Leukocytes,Ua: NEGATIVE
Nitrite: NEGATIVE
Specific Gravity, Urine: 1.02 (ref 1.000–1.030)
Total Protein, Urine: NEGATIVE
Urine Glucose: NEGATIVE
Urobilinogen, UA: 0.2 (ref 0.0–1.0)
WBC, UA: NONE SEEN — AB (ref 0–?)
pH: 7 (ref 5.0–8.0)

## 2024-02-16 LAB — CBC WITH DIFFERENTIAL/PLATELET
Basophils Absolute: 0 10*3/uL (ref 0.0–0.1)
Basophils Relative: 0.9 % (ref 0.0–3.0)
Eosinophils Absolute: 0.1 10*3/uL (ref 0.0–0.7)
Eosinophils Relative: 2.6 % (ref 0.0–5.0)
HCT: 42.5 % (ref 39.0–52.0)
Hemoglobin: 13.9 g/dL (ref 13.0–17.0)
Lymphocytes Relative: 54.7 % — ABNORMAL HIGH (ref 12.0–46.0)
Lymphs Abs: 2.8 10*3/uL (ref 0.7–4.0)
MCHC: 32.7 g/dL (ref 30.0–36.0)
MCV: 98.6 fL (ref 78.0–100.0)
Monocytes Absolute: 0.4 10*3/uL (ref 0.1–1.0)
Monocytes Relative: 8 % (ref 3.0–12.0)
Neutro Abs: 1.7 10*3/uL (ref 1.4–7.7)
Neutrophils Relative %: 33.8 % — ABNORMAL LOW (ref 43.0–77.0)
Platelets: 296 10*3/uL (ref 150.0–400.0)
RBC: 4.31 Mil/uL (ref 4.22–5.81)
RDW: 13.1 % (ref 11.5–15.5)
WBC: 5.2 10*3/uL (ref 4.0–10.5)

## 2024-02-16 LAB — LIPID PANEL
Cholesterol: 189 mg/dL (ref 0–200)
HDL: 76.1 mg/dL (ref >39.00)
LDL Cholesterol: 99 mg/dL (ref 0–99)
NonHDL: 113.35
Total CHOL/HDL Ratio: 2
Triglycerides: 70 mg/dL (ref 0.0–149.0)
VLDL: 14 mg/dL (ref 0.0–40.0)

## 2024-02-16 LAB — HEMOGLOBIN A1C: Hgb A1c MFr Bld: 5.4 % (ref 4.6–6.5)

## 2024-02-16 LAB — VITAMIN D 25 HYDROXY (VIT D DEFICIENCY, FRACTURES): VITD: 58.11 ng/mL (ref 30.00–100.00)

## 2024-02-16 LAB — TSH: TSH: 3.24 u[IU]/mL (ref 0.35–5.50)

## 2024-02-16 LAB — VITAMIN B12: Vitamin B-12: 1095 pg/mL — ABNORMAL HIGH (ref 211–911)

## 2024-02-16 NOTE — Assessment & Plan Note (Signed)
 BP Readings from Last 3 Encounters:  02/16/24 120/72  08/15/23 130/82  02/14/23 128/76   Stable, pt to continue medical treatment norvasc 10 qd

## 2024-02-16 NOTE — Assessment & Plan Note (Signed)
Last vitamin D Lab Results  Component Value Date   VD25OH 55.91 02/14/2023   Stable, cont oral replacement

## 2024-02-16 NOTE — Assessment & Plan Note (Signed)

## 2024-02-16 NOTE — Progress Notes (Signed)
 Patient ID: Patrick Dudley, male   DOB: 1941-10-23, 83 y.o.   MRN: [id number]         Chief Complaint:: wellness exam and low vit d, hyperglycemia, hld, htn,        HPI:  Patrick Dudley is a 83 y.o. male here for wellness exam; for tdap at the pharmacy, o/w up to date                Also brother died with prostate ca, and pt has seen Dr Laverle Patter urology recently with zero psa and doing well.  Pt denies chest pain, increased sob or doe, wheezing, orthopnea, PND, increased LE swelling, palpitations, dizziness or syncope.   Pt denies polydipsia, polyuria, or new focal neuro s/s.    Pt denies fever, wt loss, night sweats, loss of appetite, or other constitutional symptoms     Wt Readings from Last 3 Encounters:  02/16/24 128 lb (58.1 kg)  09/15/23 124 lb (56.2 kg)  08/15/23 124 lb (56.2 kg)   BP Readings from Last 3 Encounters:  02/16/24 120/72  08/15/23 130/82  02/14/23 128/76   Immunization History  Administered Date(s) Administered   Fluad Quad(high Dose 65+) 10/05/2019, 09/21/2022   Fluad Trivalent(High Dose 65+) 10/03/2023   Influenza, High Dose Seasonal PF 09/01/2018, 10/18/2021   Influenza-Unspecified 09/28/2020   Moderna Covid-19 Fall Seasonal Vaccine 73yrs & older 10/03/2023   Moderna Sars-Covid-2 Vaccination 03/26/2020, 04/12/2020, 12/09/2020   Pneumococcal Conjugate-13 12/26/2017, 01/26/2018   Pneumococcal Polysaccharide-23 02/10/2022   Zoster Recombinant(Shingrix) 09/21/2022, 11/21/2022   Health Maintenance Due  Topic Date Due   DTaP/Tdap/Td (1 - Tdap) Never done      Past Medical History:  Diagnosis Date   Colon polyps    Depression    Dyslipidemia    Glaucoma 02/05/2020   History of glaucoma    S/P   LASER TX  2003   History of kidney stones    Hypertension    Lymphocytosis (symptomatic)    Orofacial dyskinesia    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   Prostate cancer (HCC) DX  2009   GLEASON  3+4,  T1c,  PSA  4.59   Past Surgical History:  Procedure  Laterality Date   COLONOSCOPY  2007   CYSTOSCOPY N/A 06/05/2014   Procedure: CYSTOSCOPY;  Surgeon: Heloise Purpura, MD;  Location: [facility];  Service: Urology;  Laterality: N/A;   PROSTATE BIOPSY  MULTIPLE-  LAST ONE  03-11-2014   RADIOACTIVE SEED IMPLANT N/A 06/05/2014   Procedure: RADIOACTIVE SEED IMPLANT;  Surgeon: Heloise Purpura, MD;  Location: [facility];  Service: Urology;  Laterality: N/A;   REFRACTIVE SURGERY Left 2003   GLAUCOMA   URETEROLITHOTOMY  1980    reports that he quit smoking about 42 years ago. His smoking use included cigarettes. He started smoking about 60 years ago. He has a 9 pack-year smoking history. He has never used smokeless tobacco. He reports that he does not drink alcohol and does not use drugs. family history includes Diabetes in his brother and sister; Heart disease in his brother, brother, and sister; Nephrolithiasis in his brother; Prostate cancer in his brother and father. No Known Allergies Current Outpatient Medications on File Prior to Visit  Medication Sig Dispense Refill   amLODipine (NORVASC) 10 MG tablet TAKE 1 TABLET (10 MG TOTAL) BY MOUTH EVERY MORNING. 90 tablet 3   aspirin 81 MG tablet Take 81 mg by mouth daily.     Cholecalciferol 50 MCG (2000 UT)  CAPS Take by mouth.     clonazePAM (KLONOPIN) 0.5 MG tablet Take 0.5 mg by mouth 3 (three) times daily.      mirtazapine (REMERON) 15 MG tablet TAKE 1 TABLET BY MOUTH EVERYDAY AT BEDTIME 90 tablet 3   sildenafil (VIAGRA) 100 MG tablet SMARTSIG:1 Tablet(s) By Mouth As Directed PRN     triamcinolone ointment (KENALOG) 0.1 % Apply 1 Application topically 2 (two) times daily. 453.6 g 1   No current facility-administered medications on file prior to visit.        ROS:  All others reviewed and negative.  Objective        PE:  BP 120/72 (BP Location: Right Arm, Patient Position: Sitting, Cuff Size: Normal)   Pulse (!) 59   Temp 97.7 F (36.5 C) (Oral)   Ht 5' 9.5" (1.765  m)   Wt 128 lb (58.1 kg)   SpO2 99%   BMI 18.63 kg/m                 Constitutional: Pt appears in NAD               HENT: Head: NCAT.                Right Ear: External ear normal.                 Left Ear: External ear normal.                Eyes: . Pupils are equal, round, and reactive to light. Conjunctivae and EOM are normal               Nose: without d/c or deformity               Neck: Neck supple. Gross normal ROM               Cardiovascular: Normal rate and regular rhythm.                 Pulmonary/Chest: Effort normal and breath sounds without rales or wheezing.                Abd:  Soft, NT, ND, + BS, no organomegaly               Neurological: Pt is alert. At baseline orientation, motor grossly intact               Skin: Skin is warm. No rashes, no other new lesions, LE edema - none               Psychiatric: Pt behavior is normal without agitation   Micro: none  Cardiac tracings I have personally interpreted today:  none  Pertinent Radiological findings (summarize): none   Lab Results  Component Value Date   WBC 5.2 02/14/2023   HGB 13.7 02/14/2023   HCT 41.6 02/14/2023   PLT 287.0 02/14/2023   GLUCOSE 92 02/14/2023   CHOL 177 02/14/2023   TRIG 66.0 02/14/2023   HDL 77.60 02/14/2023   LDLCALC 86 02/14/2023   ALT 22 02/14/2023   AST 36 02/14/2023   NA 138 02/14/2023   K 4.0 02/14/2023   CL 99 02/14/2023   CREATININE 0.81 02/14/2023   BUN 15 02/14/2023   CO2 32 02/14/2023   TSH 3.38 02/14/2023   PSA 0.07 (L) 02/10/2022   INR 0.98 05/29/2014   HGBA1C 5.3 02/14/2023   Assessment/Plan:  Patrick Dudley is a 83 y.o. Black or  African American [2] male with  has a past medical history of Colon polyps, Depression, Dyslipidemia, Glaucoma (02/05/2020), History of glaucoma, History of kidney stones, Hypertension, Lymphocytosis (symptomatic), Orofacial dyskinesia, Oromandibular dystonia (04/13/2016), and Prostate cancer (HCC) (DX  2009).  Preventative health  care Age and sex appropriate education and counseling updated with regular exercise and diet Referrals for preventative services - none needed Immunizations addressed - for tdap at pharmacy Smoking counseling  - none needed Evidence for depression or other mood disorder - none significant Most recent labs reviewed. I have personally reviewed and have noted: 1) the patient's medical and social history 2) The patient's current medications and supplements 3) The patient's height, weight, and BMI have been recorded in the chart   Vitamin D deficiency Last vitamin D Lab Results  Component Value Date   VD25OH 55.91 02/14/2023   Stable, cont oral replacement   Hyperglycemia Lab Results  Component Value Date   HGBA1C 5.3 02/14/2023   Stable, pt to continue current medical treatment  - diet, wt control   HLD (hyperlipidemia) Lab Results  Component Value Date   LDLCALC 86 02/14/2023   Stable, pt to continue low chol diet   Essential hypertension BP Readings from Last 3 Encounters:  02/16/24 120/72  08/15/23 130/82  02/14/23 128/76   Stable, pt to continue medical treatment norvasc 10 qd  Followup: Return in about 1 year (around 02/15/2025).  Oliver Barre, MD 02/16/2024 10:49 AM [facility]

## 2024-02-16 NOTE — Assessment & Plan Note (Signed)
Lab Results  Component Value Date   HGBA1C 5.3 02/14/2023   Stable, pt to continue current medical treatment  - diet, wt control

## 2024-02-16 NOTE — Patient Instructions (Signed)

## 2024-02-16 NOTE — Assessment & Plan Note (Signed)
Lab Results  Component Value Date   LDLCALC 86 02/14/2023   Stable, pt to continue low chol diet

## 2024-03-31 ENCOUNTER — Other Ambulatory Visit: Payer: Self-pay | Admitting: Internal Medicine

## 2024-04-01 ENCOUNTER — Other Ambulatory Visit: Payer: Self-pay

## 2024-04-17 DIAGNOSIS — G244 Idiopathic orofacial dystonia: Secondary | ICD-10-CM | POA: Diagnosis not present

## 2024-07-29 DIAGNOSIS — H40013 Open angle with borderline findings, low risk, bilateral: Secondary | ICD-10-CM | POA: Diagnosis not present

## 2024-09-12 ENCOUNTER — Ambulatory Visit (INDEPENDENT_AMBULATORY_CARE_PROVIDER_SITE_OTHER)

## 2024-09-12 VITALS — Ht 69.0 in | Wt 132.0 lb

## 2024-09-12 DIAGNOSIS — Z0000 Encounter for general adult medical examination without abnormal findings: Secondary | ICD-10-CM

## 2024-09-12 NOTE — Progress Notes (Signed)
 Subjective:   Patrick Dudley is a 83 y.o. who presents for a Medicare Wellness preventive visit.  As a reminder, Annual Wellness Visits don't include a physical exam, and some assessments may be limited, especially if this visit is performed virtually. We may recommend an in-person follow-up visit with your provider if needed.  Visit Complete: Virtual I connected with  Patrick Dudley on 09/12/24 by a audio enabled telemedicine application and verified that I am speaking with the correct person using two identifiers.  Patient Location: Home  Provider Location: Office/Clinic  I discussed the limitations of evaluation and management by telemedicine. The patient expressed understanding and agreed to proceed.  Vital Signs: Because this visit was a virtual/telehealth visit, some criteria may be missing or patient reported. Any vitals not documented were not able to be obtained and vitals that have been documented are patient reported.  VideoDeclined- This patient declined Librarian, academic. Therefore the visit was completed with audio only.  Persons Participating in Visit: Patient.  AWV Questionnaire: No: Patient Medicare AWV questionnaire was not completed prior to this visit.  Cardiac Risk Factors include: advanced age (>56men, >64 women);dyslipidemia;hypertension;male gender     Objective:    Today's Vitals   09/12/24 1013  Weight: 132 lb (59.9 kg)  Height: 5' 9 (1.753 m)   Body mass index is 19.49 kg/m.     09/12/2024   10:12 AM 09/15/2023   11:25 AM 09/30/2022    1:04 PM 06/27/2018    1:22 PM 06/05/2014    1:35 PM 04/04/2014   10:10 AM  Advanced Directives  Does Patient Have a Medical Advance Directive? No Yes Yes No  Patient does not have advance directive;Patient would not like information  Patient has advance directive, copy not in chart   Type of Advance Directive  Living will Healthcare Power of [place];Living will     Does patient want  to make changes to medical advance directive?      No change requested   Copy of Healthcare Power of Attorney in Chart?   No - copy requested   Copy requested from family   Would patient like information on creating a medical advance directive? No - Patient declined   No - Patient declined        Data saved with a previous flowsheet row definition    Current Medications (verified) Outpatient Encounter Medications as of 09/12/2024  Medication Sig   amLODipine  (NORVASC ) 10 MG tablet TAKE 1 TABLET (10 MG TOTAL) BY MOUTH EVERY MORNING.   aspirin 81 MG tablet Take 81 mg by mouth daily.   Cholecalciferol 50 MCG (2000 UT) CAPS Take by mouth.   clonazePAM (KLONOPIN) 0.5 MG tablet Take 0.5 mg by mouth 3 (three) times daily.    mirtazapine  (REMERON ) 15 MG tablet TAKE 1 TABLET BY MOUTH EVERYDAY AT BEDTIME   sildenafil (VIAGRA) 100 MG tablet SMARTSIG:1 Tablet(s) By Mouth As Directed PRN   triamcinolone  ointment (KENALOG ) 0.1 % Apply 1 Application topically 2 (two) times daily.   No facility-administered encounter medications on file as of 09/12/2024.    Allergies (verified) Patient has no known allergies.   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   Prostate cancer (HCC) DX  2009   GLEASON  3+4,  T1c,  PSA  4.59   Past Surgical History:  Procedure Laterality Date   COLONOSCOPY  2007   CYSTOSCOPY N/A 06/05/2014   Procedure: CYSTOSCOPY;  Surgeon: Gretel Ferrara, MD;  Location: [facility];  Service: Urology;  Laterality: N/A;   PROSTATE BIOPSY  MULTIPLE-  LAST ONE  03-11-2014   RADIOACTIVE SEED IMPLANT N/A 06/05/2014   Procedure: RADIOACTIVE SEED IMPLANT;  Surgeon: Gretel Ferrara, MD;  Location: [facility];   Service: Urology;  Laterality: N/A;   REFRACTIVE SURGERY Left 2003   GLAUCOMA   URETEROLITHOTOMY  1980   Family History  Problem Relation Age of Onset   Prostate cancer Father    Nephrolithiasis Brother    Prostate cancer Brother    Diabetes Sister    Heart disease Sister    Diabetes Brother    Heart disease Brother    Heart disease Brother    Social History   Socioeconomic History   Marital status: Widowed    Spouse name: Not on file   Number of children: 1   Years of education: Not on file   Highest education level: Not on file  Occupational History   Occupation: retired  Tobacco Use   Smoking status: Former    Current packs/day: 0.00    Average packs/day: 0.5 packs/day for 18.0 years (9.0 ttl pk-yrs)    Types: Cigarettes    Start date: 12/27/1963    Quit date: 12/26/1981    Years since quitting: 42.7   Smokeless tobacco: Never  Vaping Use   Vaping status: Never Used  Substance and Sexual Activity   Alcohol use: No   Drug use: No   Sexual activity: Not Currently  Other Topics Concern   Not on file  Social History Narrative   Lives alone.   Social Drivers of Corporate investment banker Strain: Low Risk  (09/12/2024)   Overall Financial Resource Strain (CARDIA)    Difficulty of Paying Living Expenses: Not hard at all  Food Insecurity: No Food Insecurity (09/12/2024)   Hunger Vital Sign    Worried About Running Out of Food in the Last Year: Never true    Ran Out of Food in the Last Year: Never true  Transportation Needs: No Transportation Needs (09/12/2024)   PRAPARE - Administrator, Civil Service (Medical): No    Lack of Transportation (Non-Medical): No  Physical Activity: Insufficiently Active (09/12/2024)   Exercise Vital Sign    Days of Exercise per Week: 3 days    Minutes of Exercise per Session: 40 min  Stress: No Stress Concern Present (09/12/2024)   [employer] of Occupational Health - Occupational Stress Questionnaire    Feeling of  Stress: Not at all  Social Connections: Moderately Integrated (09/12/2024)   Social Connection and Isolation Panel    Frequency of Communication with Friends and Family: More than three times a week    Frequency of Social Gatherings with Friends and Family: More than three times a week    Attends Religious Services: More than 4 times per year    Active Member of [employer] or Organizations: Yes    Attends Banker Meetings: More than 4 times per year    Marital Status: Widowed    Tobacco Counseling Counseling given: Not Answered    Clinical Intake:  Pre-visit preparation completed: Yes  Pain : No/denies pain     BMI - recorded: 19.49 Nutritional Status: BMI of 19-24  Normal Nutritional Risks: None Diabetes: No  Lab Results  Component Value Date   HGBA1C 5.4 02/16/2024   HGBA1C 5.3 02/14/2023   HGBA1C 5.4 02/10/2022     How often do you need to have someone help you when you read instructions, pamphlets, or other written materials from your doctor or pharmacy?: 1 - Never  Interpreter Needed?: No  Information entered by :: Verdie Saba, CMA   Activities of Daily Living     09/12/2024   10:15 AM 09/15/2023   11:22 AM  In your present state of health, do you have any difficulty performing the following activities:  Hearing? 0 0  Vision? 0 0  Difficulty concentrating or making decisions? 0 0  Walking or climbing stairs? 0 0  Dressing or bathing? 0 0  Doing errands, shopping? 0 0  Preparing Food and eating ? N N  Using the Toilet? N N  In the past six months, have you accidently leaked urine? N N  Do you have problems with loss of bowel control? N N  Managing your Medications? N N  Managing your Finances? N N  Housekeeping or managing your Housekeeping? N     Patient Care Team: Norleen Lynwood ORN, MD as PCP - General (Internal Medicine) Regenia Prentice Clack, MD as Consulting Physician (Ophthalmology)  I have updated your Care Teams any recent Medical  Services you may have received from other providers in the past year.     Assessment:   This is a routine wellness examination for [facility].  Hearing/Vision screen Hearing Screening - Comments:: Denies hearing difficulties   Vision Screening - Comments:: Denies vision concerns - sees Dr Regenia   Goals Addressed               This Visit's Progress     Patient Stated (pt-stated)        Patient stated he plans to continue taking meds daily       Depression Screen     09/12/2024   10:16 AM 02/16/2024    9:50 AM 09/15/2023   11:32 AM 08/15/2023   10:05 AM 02/14/2023   11:54 AM 02/14/2023   11:04 AM 09/30/2022    1:15 PM  PHQ 2/9 Scores  PHQ - 2 Score 0 0 0 0 0 0 0  PHQ- 9 Score 0  0   0 0    Fall Risk     09/12/2024   10:16 AM 02/16/2024    9:58 AM 09/15/2023   11:25 AM 08/15/2023   10:05 AM 02/14/2023   11:04 AM  Fall Risk   Falls in the past year? 0 0 0 0 0  Number falls in past yr: 0 0 0 0 0  Injury with Fall? 0 0 0 0 0  Risk for fall due to : No Fall Risks No Fall Risks No Fall Risks No Fall Risks No Fall Risks  Follow up Falls evaluation completed;Falls prevention discussed Falls evaluation completed Falls prevention discussed;Falls evaluation completed Falls evaluation completed Falls evaluation completed    MEDICARE RISK AT HOME:  Medicare Risk at Home Any stairs in or around the home?: No If so, are there any without handrails?: No Home free of loose throw rugs in walkways, pet beds, electrical cords, etc?: Yes Adequate lighting in your home to reduce risk of falls?: Yes Life alert?: No Use of a cane, walker or  w/c?: No Grab bars in the bathroom?: No Shower chair or bench in shower?: No Elevated toilet seat or a handicapped toilet?: No  TIMED UP AND GO:  Was the test performed?  No  Cognitive Function: 6CIT completed        09/12/2024   10:18 AM 09/15/2023   11:26 AM 09/30/2022    1:19 PM  6CIT Screen  What Year? 0 points 0 points 0 points  What  month? 0 points 0 points 0 points  What time? 0 points 0 points 0 points  Count back from 20 0 points 0 points 0 points  Months in reverse 0 points 2 points 0 points  Repeat phrase 2 points 0 points 0 points  Total Score 2 points 2 points 0 points    Immunizations Immunization History  Administered Date(s) Administered   Fluad Quad(high Dose 65+) 10/05/2019, 09/21/2022   Fluad Trivalent(High Dose 65+) 10/03/2023   INFLUENZA, HIGH DOSE SEASONAL PF 09/01/2018, 10/18/2021   Influenza-Unspecified 09/28/2020   Moderna Covid-19 Fall Seasonal Vaccine 58yrs & older 10/03/2023   Moderna Sars-Covid-2 Vaccination 03/26/2020, 04/12/2020, 12/09/2020   Pneumococcal Conjugate-13 12/26/2017, 01/26/2018   Pneumococcal Polysaccharide-23 02/10/2022   Zoster Recombinant(Shingrix) 09/21/2022, 11/21/2022    Screening Tests Health Maintenance  Topic Date Due   DTaP/Tdap/Td (1 - Tdap) Never done   Influenza Vaccine  07/26/2024   Medicare Annual Wellness (AWV)  09/12/2025   Pneumococcal Vaccine: 50+ Years  Completed   Zoster Vaccines- Shingrix  Completed   HPV VACCINES  Aged Out   Meningococcal B Vaccine  Aged Out   Colonoscopy  Discontinued   COVID-19 Vaccine  Discontinued   Hepatitis C Screening  Discontinued    Health Maintenance Items Addressed:   Additional Screening:  Vision Screening: Recommended annual ophthalmology exams for early detection of glaucoma and other disorders of the eye. Is the patient up to date with their annual eye exam?  Yes  Who is the provider or what is the name of the office in which the patient attends annual eye exams? [facility]  Dental Screening: Recommended annual dental exams for proper oral hygiene  Community Resource Referral / Chronic Care Management: CRR required this visit?  No   CCM required this visit?  No   Plan:    I have personally reviewed and noted the following in the patient's chart:   Medical and social history Use of alcohol,  tobacco or illicit drugs  Current medications and supplements including opioid prescriptions. Patient is not currently taking opioid prescriptions. Functional ability and status Nutritional status Physical activity Advanced directives List of other physicians Hospitalizations, surgeries, and ER visits in previous 12 months Vitals Screenings to include cognitive, depression, and falls Referrals and appointments  In addition, I have reviewed and discussed with patient certain preventive protocols, quality metrics, and best practice recommendations. A written personalized care plan for preventive services as well as general preventive health recommendations were provided to patient.   Verdie CHRISTELLA Saba, CMA   09/12/2024   After Visit Summary: (Declined) Due to this being a telephonic visit, with patients personalized plan was offered to patient but patient Declined AVS at this time   Notes: Nothing significant to report at this time.

## 2024-09-12 NOTE — Patient Instructions (Addendum)
 Mr. Allbaugh,  Thank you for taking the time for your Medicare Wellness Visit. I appreciate your continued commitment to your health goals. Please review the care plan we discussed, and feel free to reach out if I can assist you further.  Medicare recommends these wellness visits once per year to help you and your care team stay ahead of potential health issues. These visits are designed to focus on prevention, allowing your provider to concentrate on managing your acute and chronic conditions during your regular appointments.  Please note that Annual Wellness Visits do not include a physical exam. Some assessments may be limited, especially if the visit was conducted virtually. If needed, we may recommend a separate in-person follow-up with your provider.  Ongoing Care Seeing your primary care provider every 3 to 6 months helps us  monitor your health and provide consistent, personalized care.   Referrals If a referral was made during today's visit and you haven't received any updates within two weeks, please contact the referred provider directly to check on the status.  Recommended Screenings:  Health Maintenance  Topic Date Due   DTaP/Tdap/Td vaccine (1 - Tdap) Never done   Flu Shot  07/26/2024   Medicare Annual Wellness Visit  09/12/2025   Pneumococcal Vaccine for age over 46  Completed   Zoster (Shingles) Vaccine  Completed   HPV Vaccine  Aged Out   Meningitis B Vaccine  Aged Out   Colon Cancer Screening  Discontinued   COVID-19 Vaccine  Discontinued   Hepatitis C Screening  Discontinued       09/12/2024   10:12 AM  Advanced Directives  Does Patient Have a Medical Advance Directive? No  Would patient like information on creating a medical advance directive? No - Patient declined   Advance Care Planning is important because it: Ensures you receive medical care that aligns with your values, goals, and preferences. Provides guidance to your family and loved ones, reducing the  emotional burden of decision-making during critical moments.  Vision: Annual vision screenings are recommended for early detection of glaucoma, cataracts, and diabetic retinopathy. These exams can also reveal signs of chronic conditions such as diabetes and high blood pressure.  Dental: Annual dental screenings help detect early signs of oral cancer, gum disease, and other conditions linked to overall health, including heart disease and diabetes.

## 2024-09-16 ENCOUNTER — Other Ambulatory Visit: Payer: Self-pay | Admitting: Internal Medicine

## 2024-10-22 DIAGNOSIS — I10 Essential (primary) hypertension: Secondary | ICD-10-CM | POA: Diagnosis not present

## 2024-10-22 DIAGNOSIS — G244 Idiopathic orofacial dystonia: Secondary | ICD-10-CM | POA: Diagnosis not present

## 2024-12-24 ENCOUNTER — Other Ambulatory Visit: Payer: Self-pay | Admitting: Internal Medicine

## 2024-12-24 ENCOUNTER — Other Ambulatory Visit: Payer: Self-pay

## 2025-01-06 ENCOUNTER — Ambulatory Visit: Admitting: Family Medicine

## 2025-02-03 ENCOUNTER — Ambulatory Visit: Admitting: Family Medicine

## 2025-02-17 ENCOUNTER — Ambulatory Visit: Payer: Medicare HMO | Admitting: Internal Medicine

## 2025-09-16 ENCOUNTER — Ambulatory Visit
